# Patient Record
Sex: Female | Born: 1985 | Race: White | Hispanic: Yes | Marital: Married | Smoking: Never smoker
Health system: Southern US, Community
[De-identification: ages and names within clinical notes are randomized; demographics above are authoritative.]

## PROBLEM LIST (undated history)

## (undated) DIAGNOSIS — Z789 Other specified health status: Secondary | ICD-10-CM

## (undated) HISTORY — PX: NO PAST SURGERIES: SHX2092

## (undated) HISTORY — DX: Other specified health status: Z78.9

---

## 2008-09-05 ENCOUNTER — Emergency Department (HOSPITAL_COMMUNITY): Admission: EM | Admit: 2008-09-05 | Discharge: 2008-09-05 | Payer: Self-pay | Admitting: Emergency Medicine

## 2010-06-13 ENCOUNTER — Ambulatory Visit: Payer: Self-pay | Admitting: Gynecology

## 2010-06-13 ENCOUNTER — Inpatient Hospital Stay (HOSPITAL_COMMUNITY): Admission: AD | Admit: 2010-06-13 | Discharge: 2010-06-13 | Payer: Self-pay | Admitting: Obstetrics and Gynecology

## 2010-10-23 LAB — URINALYSIS, ROUTINE W REFLEX MICROSCOPIC
Bilirubin Urine: NEGATIVE
Ketones, ur: NEGATIVE mg/dL
Protein, ur: NEGATIVE mg/dL
Urobilinogen, UA: 0.2 mg/dL (ref 0.0–1.0)

## 2010-10-23 LAB — WET PREP, GENITAL

## 2010-10-23 LAB — URINE MICROSCOPIC-ADD ON

## 2010-10-23 LAB — CBC
Platelets: 259 10*3/uL (ref 150–400)
RDW: 13.2 % (ref 11.5–15.5)
WBC: 9.4 10*3/uL (ref 4.0–10.5)

## 2010-10-23 LAB — ABO/RH: ABO/RH(D): A POS

## 2010-10-23 LAB — GC/CHLAMYDIA PROBE AMP, GENITAL
Chlamydia, DNA Probe: NEGATIVE
GC Probe Amp, Genital: NEGATIVE

## 2010-11-26 LAB — DIFFERENTIAL
Basophils Absolute: 0 10*3/uL (ref 0.0–0.1)
Eosinophils Relative: 1 % (ref 0–5)
Lymphocytes Relative: 29 % (ref 12–46)
Monocytes Absolute: 0.4 10*3/uL (ref 0.1–1.0)
Monocytes Relative: 6 % (ref 3–12)
Neutro Abs: 4.1 10*3/uL (ref 1.7–7.7)

## 2010-11-26 LAB — COMPREHENSIVE METABOLIC PANEL
AST: 17 U/L (ref 0–37)
Albumin: 3.6 g/dL (ref 3.5–5.2)
Chloride: 103 mEq/L (ref 96–112)
Creatinine, Ser: 0.39 mg/dL — ABNORMAL LOW (ref 0.4–1.2)
GFR calc Af Amer: >60 mL/min (ref >60)
Potassium: 4 mEq/L (ref 3.5–5.1)
Total Bilirubin: 0.8 mg/dL (ref 0.3–1.2)
Total Protein: 7.3 g/dL (ref 6.0–8.3)

## 2010-11-26 LAB — URINALYSIS, ROUTINE W REFLEX MICROSCOPIC
Nitrite: NEGATIVE
Protein, ur: NEGATIVE mg/dL
Specific Gravity, Urine: 1.021 (ref 1.005–1.030)
Urobilinogen, UA: 1 mg/dL (ref 0.0–1.0)

## 2010-11-26 LAB — CBC
MCV: 83.8 fL (ref 78.0–100.0)
Platelets: 252 10*3/uL (ref 150–400)
RDW: 13.8 % (ref 11.5–15.5)
WBC: 6.4 10*3/uL (ref 4.0–10.5)

## 2010-11-26 LAB — POCT PREGNANCY, URINE: Preg Test, Ur: NEGATIVE

## 2011-01-11 ENCOUNTER — Inpatient Hospital Stay (HOSPITAL_COMMUNITY): Admission: AD | Admit: 2011-01-11 | Payer: Self-pay | Admitting: Obstetrics

## 2011-01-16 ENCOUNTER — Inpatient Hospital Stay (HOSPITAL_COMMUNITY)
Admission: AD | Admit: 2011-01-16 | Discharge: 2011-01-19 | DRG: 775 | Disposition: A | Discharge status: Home / Self Care | Payer: Medicaid Other | Source: Ambulatory Visit | Attending: Obstetrics | Admitting: Obstetrics

## 2011-01-16 LAB — COMPREHENSIVE METABOLIC PANEL
AST: 17 U/L (ref 0–37)
BUN: 6 mg/dL (ref 6–23)
CO2: 23 mEq/L (ref 19–32)
Calcium: 8.9 mg/dL (ref 8.4–10.5)
Chloride: 102 mEq/L (ref 96–112)
Creatinine, Ser: <0.47 mg/dL (ref 0.4–1.2)
Glucose, Bld: 71 mg/dL (ref 70–99)
Total Bilirubin: 0.4 mg/dL (ref 0.3–1.2)

## 2011-01-16 LAB — URIC ACID: Uric Acid, Serum: 6 mg/dL (ref 2.4–7.0)

## 2011-01-16 LAB — LACTATE DEHYDROGENASE: LDH: 198 U/L (ref 94–250)

## 2011-01-16 LAB — CBC
HCT: 33.8 % — ABNORMAL LOW (ref 36.0–46.0)
HCT: 32.6 % — ABNORMAL LOW (ref 36.0–46.0)
Hemoglobin: 11.2 g/dL — ABNORMAL LOW (ref 12.0–15.0)
MCH: 27.1 pg (ref 26.0–34.0)
MCHC: 33.1 g/dL (ref 30.0–36.0)
MCHC: 33.7 g/dL (ref 30.0–36.0)
MCV: 81.8 fL (ref 78.0–100.0)
Platelets: 153 10*3/uL (ref 150–400)
RBC: 4.13 MIL/uL (ref 3.87–5.11)
RDW: 21.6 % — ABNORMAL HIGH (ref 11.5–15.5)
WBC: 10.6 10*3/uL — ABNORMAL HIGH (ref 4.0–10.5)

## 2011-01-16 LAB — RPR: RPR Ser Ql: NONREACTIVE

## 2011-01-17 DIAGNOSIS — O24419 Gestational diabetes mellitus in pregnancy, unspecified control: Secondary | ICD-10-CM

## 2011-01-18 LAB — CBC
MCV: 82 fL (ref 78.0–100.0)
Platelets: 156 10*3/uL (ref 150–400)
RBC: 2.94 MIL/uL — ABNORMAL LOW (ref 3.87–5.11)
RDW: 22.3 % — ABNORMAL HIGH (ref 11.5–15.5)
WBC: 14.4 10*3/uL — ABNORMAL HIGH (ref 4.0–10.5)

## 2011-01-20 LAB — MRSA CULTURE

## 2011-03-26 IMAGING — US US OB COMP LESS 14 WK
1 series · 14 of 28 positions shown · non-contrast
Comparison: None.

CLINICAL DATA: 23-year-old female with bleeding and pain at 14
weeks.

OBSTETRIC <14 WK ULTRASOUND
TECHNIQUE: Transabdominal ultrasound was performed for evaluation
of the gestation as well as the maternal uterus and adnexal
regions.

[Series 1: us ob comp less 14 wks · 0.24mm/px · 14 of 30 slices shown]
[im 2/30]
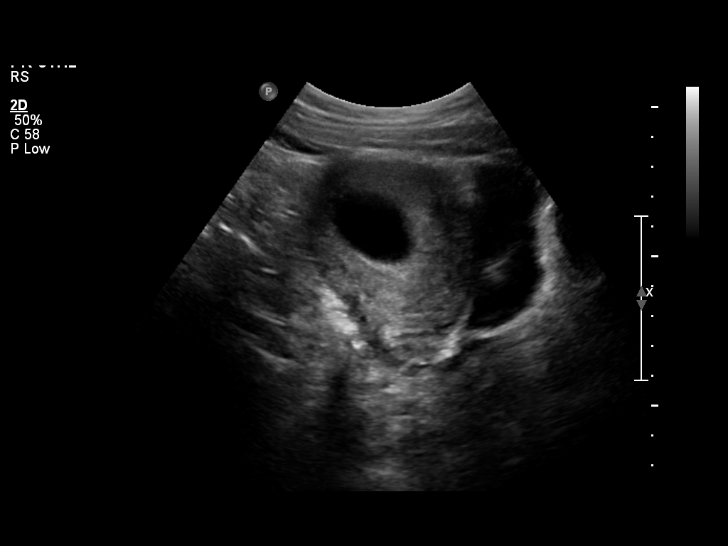
[im 4/30]
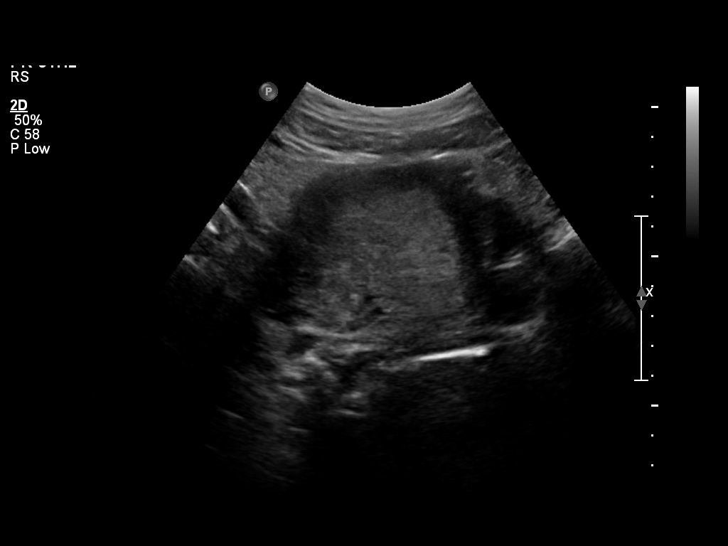
[im 6/30]
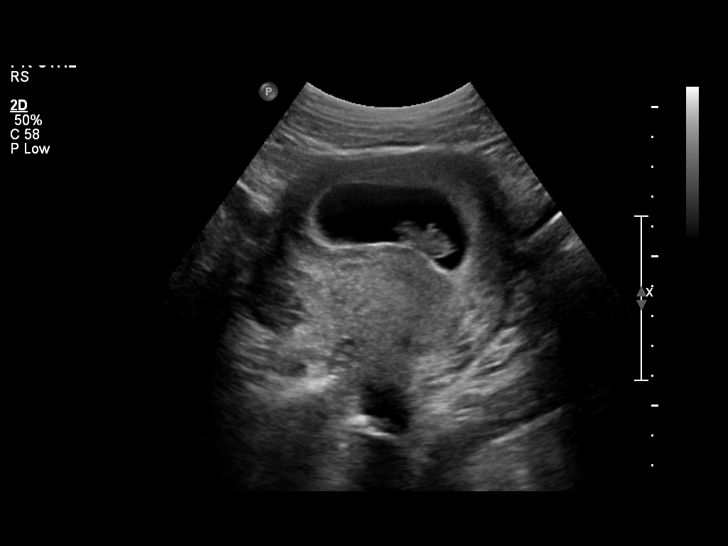
[im 8/30]
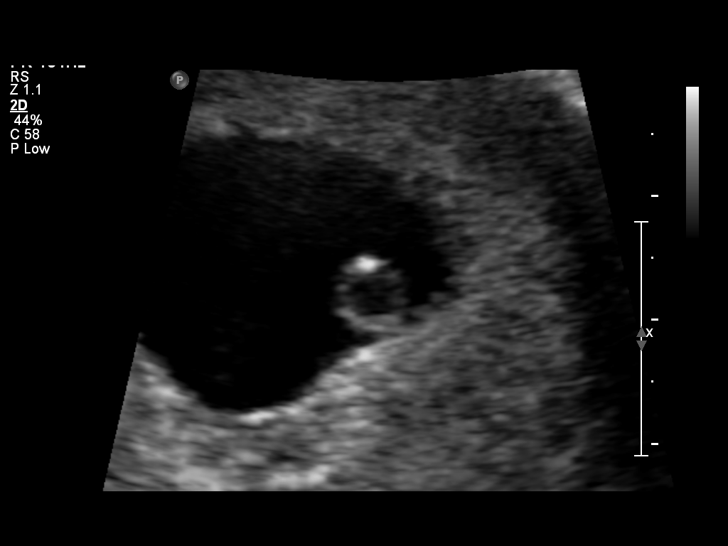
[im 10/30]
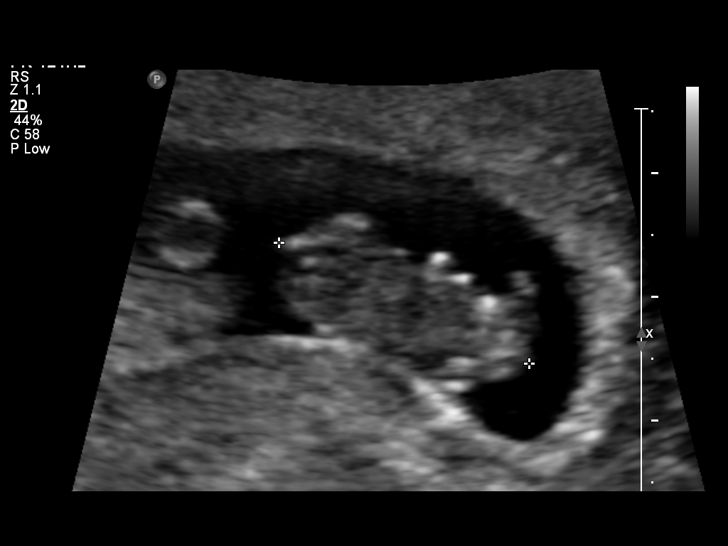
[im 12/30]
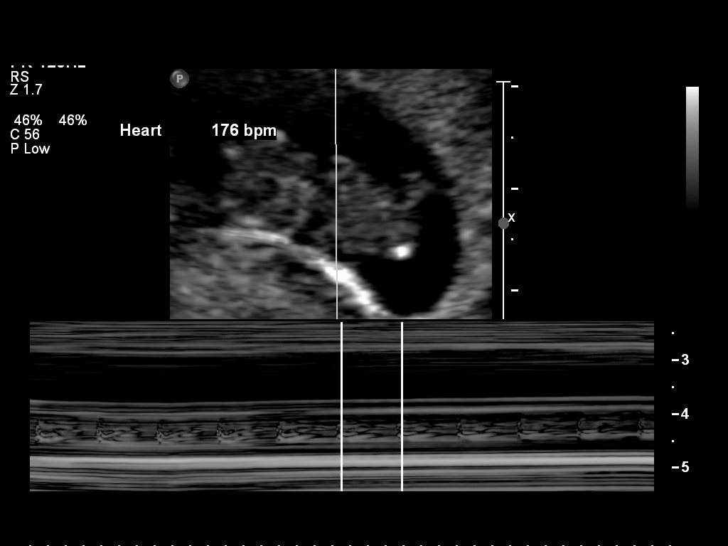
[im 14/30]
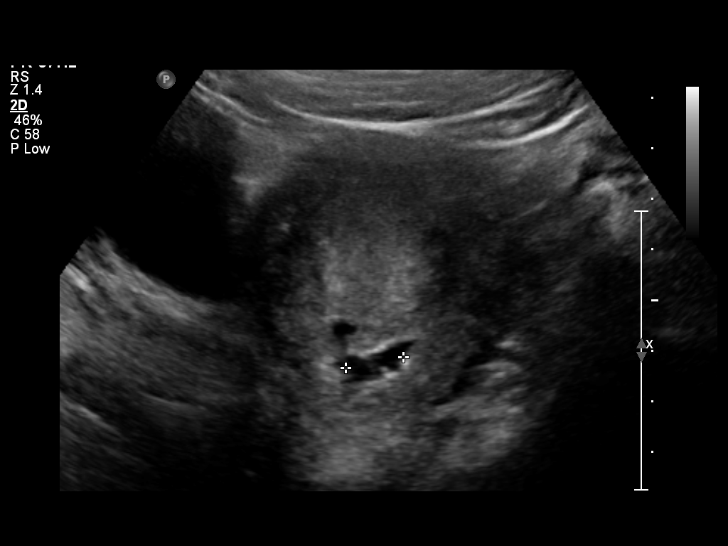
[im 17/30]
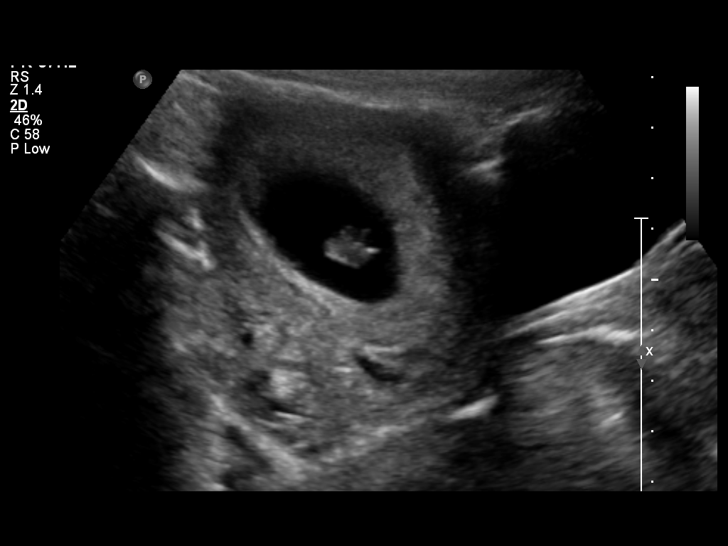
[im 19/30]
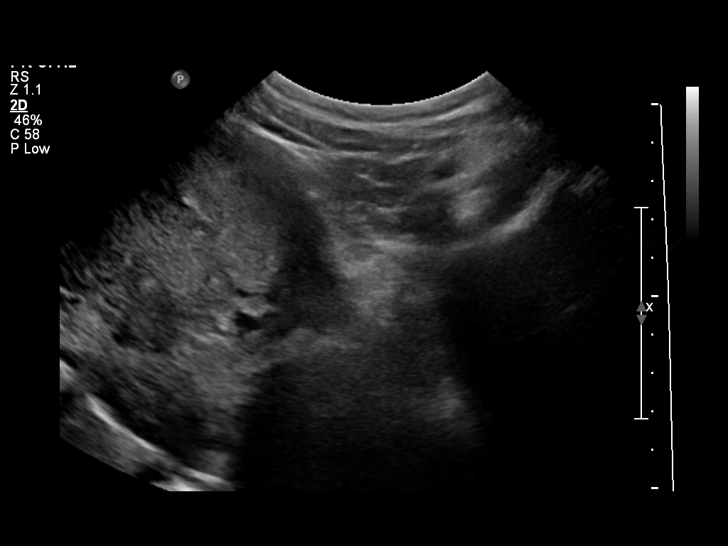
[im 21/30]
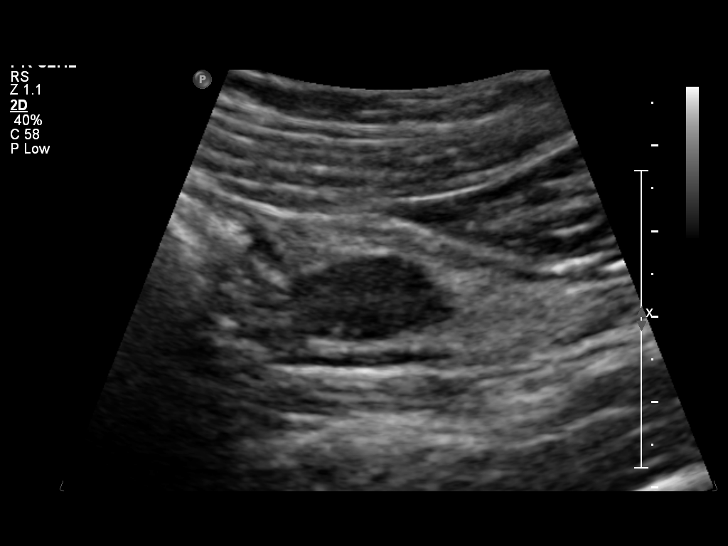
[im 23/30]
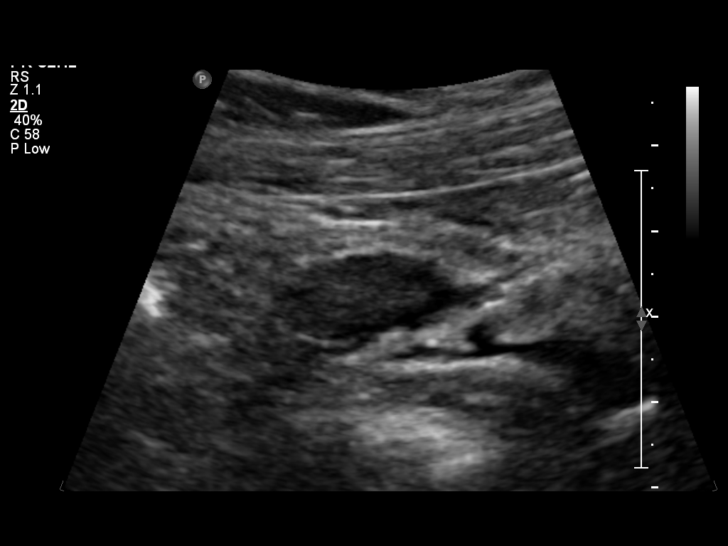
[im 25/30]
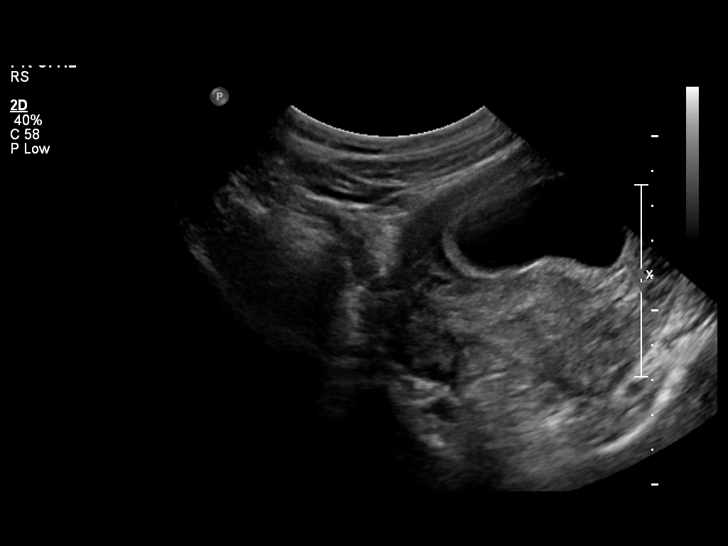
[im 27/30]
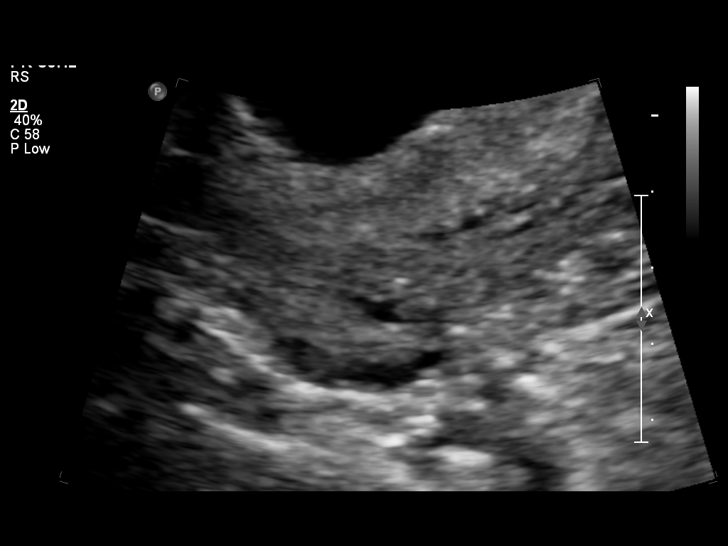
[im 30/30]
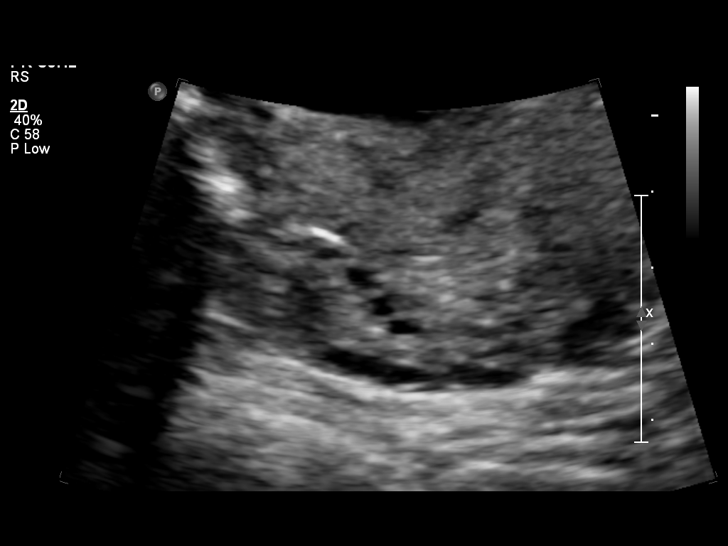

[14 of 28 positions shown; findings below may reference images not displayed]

Intrauterine gestational sac: Single
Yolk sac: Present
Embryo: Present
Cardiac Activity: Detected
Heart Rate: 176 bpm

CRL:  2.2 cm mm  9w  0d

Maternal uterus/Adnexae:
Small volume of subchorionic hemorrhage.  No pelvic free fluid.
Both ovaries appear within normal limits, the left measuring 1.8 x
2.1 x 1.0 cm and the right measuring 2.4 x 3.2 x 1.5 cm.
IMPRESSION: Viable singleton intrauterine pregnancy with estimated gestational
age of 9 weeks and 0 days by crown-rump length.  Small volume
subchorionic hemorrhage.

## 2011-09-14 ENCOUNTER — Encounter (HOSPITAL_COMMUNITY): Payer: Self-pay | Admitting: Emergency Medicine

## 2011-09-14 ENCOUNTER — Emergency Department (HOSPITAL_COMMUNITY)
Admission: EM | Admit: 2011-09-14 | Discharge: 2011-09-14 | Disposition: A | Discharge status: Home / Self Care | Payer: Self-pay | Attending: Emergency Medicine | Admitting: Emergency Medicine

## 2011-09-14 DIAGNOSIS — R1013 Epigastric pain: Secondary | ICD-10-CM | POA: Insufficient documentation

## 2011-09-14 LAB — POCT PREGNANCY, URINE: Preg Test, Ur: NEGATIVE

## 2011-09-14 LAB — URINALYSIS, MICROSCOPIC ONLY
Glucose, UA: NEGATIVE mg/dL
Nitrite: NEGATIVE
Specific Gravity, Urine: 1.016 (ref 1.005–1.030)
pH: 7.5 (ref 5.0–8.0)

## 2011-09-14 LAB — CBC
MCH: 28.2 pg (ref 26.0–34.0)
MCHC: 35.1 g/dL (ref 30.0–36.0)
RDW: 13.6 % (ref 11.5–15.5)

## 2011-09-14 LAB — BASIC METABOLIC PANEL
Chloride: 101 mEq/L (ref 96–112)
Creatinine, Ser: 0.48 mg/dL — ABNORMAL LOW (ref 0.50–1.10)
GFR calc Af Amer: >90 mL/min (ref >90)
GFR calc non Af Amer: >90 mL/min (ref >90)

## 2011-09-14 LAB — HEPATIC FUNCTION PANEL
ALT: 32 U/L (ref 0–35)
Bilirubin, Direct: <0.1 mg/dL (ref 0.0–0.3)
Total Protein: 8.5 g/dL — ABNORMAL HIGH (ref 6.0–8.3)

## 2011-09-14 LAB — DIFFERENTIAL
Basophils Absolute: 0 10*3/uL (ref 0.0–0.1)
Basophils Relative: 0 % (ref 0–1)
Eosinophils Absolute: 0.1 10*3/uL (ref 0.0–0.7)
Monocytes Absolute: 0.6 10*3/uL (ref 0.1–1.0)
Neutro Abs: 5.3 10*3/uL (ref 1.7–7.7)
Neutrophils Relative %: 64 % (ref 43–77)

## 2011-09-14 LAB — LIPASE, BLOOD: Lipase: 38 U/L (ref 11–59)

## 2011-09-14 NOTE — ED Notes (Signed)
Pt states that she is having abdominal pain. Pt states pain started today and that pain is mid abdomen. Pt denies any problems with urination or bowel movements. Pt denies N/V/D. Pt alert and oriented and able to follow commands and move extremities.

## 2011-09-14 NOTE — ED Provider Notes (Signed)
History     CSN: [id number]  Arrival date & time 09/14/11  0046   First MD Initiated Contact with Patient 09/14/11 0126      Chief Complaint  Patient presents with  . Abdominal Pain    (Consider location/radiation/quality/duration/timing/severity/associated sxs/prior treatment) HPI This 26 year old female is now pain free but had a few hours of upper abdominal epigastric pain prior to arrival tonight. She had no associated symptoms and has not had this discomfort before but it is now resolved. She has had no fever cough chest pain shortness of breath lower abdominal pain vaginal bleeding vaginal discharge dysuria or other concerns. There was no treatment prior to arrival. Her pain was moderate but is now resolved. Her pain was nonradiating. History reviewed. No pertinent past medical history.  History reviewed. No pertinent past surgical history.  No family history on file.  History  Substance Use Topics  . Smoking status: Never Smoker   . Smokeless tobacco: Not on file  . Alcohol Use: No    OB History    Grav Para Term Preterm Abortions TAB SAB Ect Mult Living                  Review of Systems  Constitutional: Negative for fever.       10 Systems reviewed and are negative for acute change except as noted in the HPI.  HENT: Negative for congestion.   Eyes: Negative for discharge and redness.  Respiratory: Negative for cough and shortness of breath.   Cardiovascular: Negative for chest pain.  Gastrointestinal: Positive for abdominal pain. Negative for vomiting and diarrhea.  Genitourinary: Negative for dysuria, vaginal bleeding and vaginal discharge.  Musculoskeletal: Negative for back pain.  Skin: Negative for rash.  Neurological: Negative for syncope, numbness and headaches.  Psychiatric/Behavioral:       No behavior change.    Allergies  Review of patient's allergies indicates no known allergies.  Home Medications  No current outpatient prescriptions on  file.  BP 120/89  Pulse 78  Temp(Src) 97.8 F (36.6 C) (Oral)  Resp 16  SpO2 100%  LMP 09/12/2011  Physical Exam  Nursing note and vitals reviewed. Constitutional:       Awake, alert, nontoxic appearance.  HENT:  Head: Atraumatic.  Eyes: Right eye exhibits no discharge. Left eye exhibits no discharge.  Neck: Neck supple.  Cardiovascular: Normal rate and regular rhythm.   No murmur heard. Pulmonary/Chest: Effort normal and breath sounds normal. No respiratory distress. She has no wheezes. She has no rales. She exhibits no tenderness.  Abdominal: Soft. There is no tenderness. There is no rebound.       No CVA tenderness  Musculoskeletal: She exhibits no edema and no tenderness.       Baseline ROM, no obvious new focal weakness.  Neurological:       Mental status and motor strength appears baseline for patient and situation.  Skin: No rash noted.  Psychiatric: She has a normal mood and affect.    ED Course  Procedures (including critical care time) The patient remains pain-free.Patient / Family / Caregiver informed of clinical course, understand medical decision-making process, and agree with plan. Labs Reviewed  URINALYSIS, WITH MICROSCOPIC - Abnormal; Notable for the following:    APPearance CLOUDY (*)    Leukocytes, UA TRACE (*)    Bacteria, UA FEW (*)    Squamous Epithelial / LPF MANY (*)    All other components within normal limits  BASIC METABOLIC PANEL - Abnormal;  Notable for the following:    Glucose, Bld 132 (*)    Creatinine, Ser 0.48 (*)    All other components within normal limits  HEPATIC FUNCTION PANEL - Abnormal; Notable for the following:    Total Protein 8.5 (*)    All other components within normal limits  CBC  DIFFERENTIAL  LIPASE, BLOOD  POCT PREGNANCY, URINE  LAB REPORT - SCANNED   No results found.   1. Abdominal pain       MDM  I doubt any other EMC precluding discharge at this time including, but not necessarily limited to the  following:SBI, peritonitis.        Hurman Horn, MD 09/15/11 [phone]

## 2011-09-14 NOTE — ED Notes (Signed)
PT. REPORTS UPPER ABDOMINAL PAIN ONSET TODAY , DENIES NAUSEA, VOMITTING OR DIARRHEA , NO FEVER OR CHILLS . NO DYSURIA.

## 2014-05-30 ENCOUNTER — Other Ambulatory Visit (HOSPITAL_COMMUNITY): Payer: Self-pay | Admitting: Physician Assistant

## 2014-05-30 DIAGNOSIS — Z3689 Encounter for other specified antenatal screening: Secondary | ICD-10-CM

## 2014-05-30 LAB — OB RESULTS CONSOLE ANTIBODY SCREEN: ANTIBODY SCREEN: NEGATIVE

## 2014-05-30 LAB — SICKLE CELL SCREEN

## 2014-05-30 LAB — CYTOLOGY - PAP: Pap: NEGATIVE

## 2014-05-30 LAB — OB RESULTS CONSOLE GC/CHLAMYDIA
Chlamydia: NEGATIVE
GC PROBE AMP, GENITAL: NEGATIVE

## 2014-05-30 LAB — OB RESULTS CONSOLE ABO/RH: RH Type: POSITIVE

## 2014-05-30 LAB — OB RESULTS CONSOLE VARICELLA ZOSTER ANTIBODY, IGG
Varicella: IMMUNE
Varicella: IMMUNE

## 2014-05-30 LAB — GLUCOSE TOLERANCE, 1 HOUR (50G) W/O FASTING: GLUCOSE 1 HOUR GTT: 109 mg/dL (ref ?–200)

## 2014-05-30 LAB — OB RESULTS CONSOLE HGB/HCT, BLOOD
HCT: 36 %
Hemoglobin: 11.5 g/dL

## 2014-05-30 LAB — OB RESULTS CONSOLE HIV ANTIBODY (ROUTINE TESTING): HIV: NONREACTIVE

## 2014-05-30 LAB — OB RESULTS CONSOLE HEPATITIS B SURFACE ANTIGEN: HEP B S AG: NEGATIVE

## 2014-05-30 LAB — OB RESULTS CONSOLE RUBELLA ANTIBODY, IGM: Rubella: IMMUNE

## 2014-05-30 LAB — OB RESULTS CONSOLE PLATELET COUNT: Platelets: 248 10*3/uL

## 2014-05-30 LAB — OB RESULTS CONSOLE RPR: RPR: NONREACTIVE

## 2014-05-30 LAB — CULTURE, OB URINE: Urine Culture, OB: NEGATIVE

## 2014-07-06 ENCOUNTER — Ambulatory Visit (HOSPITAL_COMMUNITY): Payer: Medicaid Other

## 2014-08-12 NOTE — L&D Delivery Note (Signed)
Patient is 29 y.o. G2P1001 515w1d admitted IOL, hx of A2DM   Delivery Note At 4:24 AM a viable female was delivered via Vaginal, Spontaneous Delivery (Presentation: ROA ;  ).  APGAR: 8, 9; weight pending .   Placenta status: Intact, Spontaneous.  Cord: 3 vessels with the following complications: None.  Infant dried and placed on mother's abdomen.  Cord clamped and cut by FOB.  Hospital cord blood sample collected.  Anesthesia: Epidural  Episiotomy: None Lacerations:  2nd degree perineal repaired by Dr Fredirick LatheKristy Acosta Suture Repair: 3.0 vicryl rapide Est. Blood Loss (mL):  250cc  Mom to postpartum.  Baby to Couplet care / Skin to Skin.  Delynn FlavinGottschalk, Ashly M, DO 11/27/2014, 4:51 AM

## 2014-09-15 LAB — GLUCOSE TOLERANCE, 1 HOUR (50G) W/O FASTING: Glucose, GTT - 1 Hour: 169 mg/dL (ref ?–200)

## 2014-09-15 LAB — OB RESULTS CONSOLE HGB/HCT, BLOOD
HCT: 35 %
Hemoglobin: 11 g/dL

## 2014-09-15 LAB — OB RESULTS CONSOLE RPR: RPR: NONREACTIVE

## 2014-09-19 LAB — GLUCOSE TOLERANCE, 3 HOURS
GLUCOSE 1 HOUR GTT: 191 mg/dL (ref ?–200)
Glucose, Fasting: 79 mg/dL (ref 60–109)
Glucose, GTT - 2 Hour: 168 mg/dL — AB (ref ?–140)
Glucose, GTT - 3 Hour: 144 mg/dL — AB (ref ?–140)

## 2014-10-03 ENCOUNTER — Encounter: Payer: Self-pay | Attending: Obstetrics & Gynecology | Admitting: *Deleted

## 2014-10-03 ENCOUNTER — Ambulatory Visit: Payer: Medicaid Other | Admitting: *Deleted

## 2014-10-03 VITALS — Wt 117.6 lb

## 2014-10-03 DIAGNOSIS — O24410 Gestational diabetes mellitus in pregnancy, diet controlled: Secondary | ICD-10-CM

## 2014-10-03 DIAGNOSIS — Z713 Dietary counseling and surveillance: Secondary | ICD-10-CM | POA: Insufficient documentation

## 2014-10-03 DIAGNOSIS — O24913 Unspecified diabetes mellitus in pregnancy, third trimester: Secondary | ICD-10-CM | POA: Insufficient documentation

## 2014-10-03 NOTE — Progress Notes (Signed)
Nutrition note: GDM diet education Pt is a newly diagnosed GDM pt. Pt has gained 7.6# @ 31w, which is < expected. Pt reports eating ~3x/d. Pt is taking a PNV. Pt reports no N/V but has heartburn occ. NKFA. Pt reports walking 2-3x/wk for 15mins. Pt received verbal & written education in Spanish via an interpreter about GDM diet. Encouraged walking/ physical activity daily. Encouraged pt to include a protein source with all meals & snacks and to eat q 2-3hrs to help pt to gain more wt. Discussed wt gain goals of 25-35# to 1#/wk. Pt agrees to follow GDM diet with 3 meals & 1-3 snacks/d with proper CHO/ protein combination. Pt has WIC & plans to BF. F/u in 2-4 wks Doris RevealLaura Bernis Schreur, MS, RD, LDN, [facility]

## 2014-10-03 NOTE — Progress Notes (Signed)
  Patient was seen on 10/03/14 for Gestational Diabetes self-management .Verdis Frederickson In house Interpreter used for session.  The following learning objectives were met by the patient :   States the definition of Gestational Diabetes  States when to check blood glucose levels  Demonstrates proper blood glucose monitoring techniques  States the effect of stress and exercise on blood glucose levels  States the importance of limiting caffeine and abstaining from alcohol and smoking  Plan:  Consider  increasing your activity level by walking daily as tolerated Begin checking BG before breakfast and 2 hours after first bit of breakfast, lunch and dinner after  as directed by MD  Take medication  as directed by MD  Blood glucose monitor given: TrueTrack Lot # [id number] Exp: 2016/03/11  Patient instructed to monitor glucose levels: FBS: 60 - <90 2 hour: <120  Patient received the following handouts:  Nutrition Diabetes and Pregnancy  Carbohydrate Counting List  Meal Planning worksheet  Patient will be seen for follow-up as needed.

## 2014-10-04 ENCOUNTER — Encounter: Payer: Self-pay | Admitting: *Deleted

## 2014-10-04 DIAGNOSIS — O0993 Supervision of high risk pregnancy, unspecified, third trimester: Secondary | ICD-10-CM

## 2014-10-04 DIAGNOSIS — O0990 Supervision of high risk pregnancy, unspecified, unspecified trimester: Secondary | ICD-10-CM | POA: Insufficient documentation

## 2014-10-10 ENCOUNTER — Encounter: Payer: Self-pay | Attending: Obstetrics & Gynecology | Admitting: *Deleted

## 2014-10-10 ENCOUNTER — Ambulatory Visit (INDEPENDENT_AMBULATORY_CARE_PROVIDER_SITE_OTHER): Payer: Self-pay | Admitting: Obstetrics & Gynecology

## 2014-10-10 ENCOUNTER — Encounter: Payer: Self-pay | Admitting: Obstetrics & Gynecology

## 2014-10-10 VITALS — BP 120/76 | HR 97 | Temp 98.1°F | Ht 59.5 in | Wt 117.6 lb

## 2014-10-10 DIAGNOSIS — Z8632 Personal history of gestational diabetes: Secondary | ICD-10-CM | POA: Insufficient documentation

## 2014-10-10 DIAGNOSIS — O24913 Unspecified diabetes mellitus in pregnancy, third trimester: Secondary | ICD-10-CM

## 2014-10-10 DIAGNOSIS — Z713 Dietary counseling and surveillance: Secondary | ICD-10-CM | POA: Insufficient documentation

## 2014-10-10 LAB — POCT URINALYSIS DIP (DEVICE)
BILIRUBIN URINE: NEGATIVE
GLUCOSE, UA: NEGATIVE mg/dL
KETONES UR: NEGATIVE mg/dL
Nitrite: NEGATIVE
PROTEIN: NEGATIVE mg/dL
SPECIFIC GRAVITY, URINE: 1.01 (ref 1.005–1.030)
Urobilinogen, UA: 0.2 mg/dL (ref 0.0–1.0)
pH: 7 (ref 5.0–8.0)

## 2014-10-10 NOTE — Progress Notes (Signed)
[facility], has started testing and most< 120, rare to 151, FBS <90  Subjective:transfer gest DM    Doris Hendricks is a G2P1001 6957w2d being seen today for her first obstetrical visit.  Her obstetrical history is significant for failed 3 hr GTT. Patient does intend to breast feed. Pregnancy history fully reviewed.  Patient reports no complaints.  Filed Vitals:   10/10/14 0828 10/10/14 0831  BP: 120/76   Pulse: 97   Temp: 98.1 F (36.7 C)   Height:  4' 11.5" (1.511 m)  Weight: 117 lb 9.6 oz (53.343 kg)     HISTORY: OB History  Gravida Para Term Preterm AB SAB TAB Ectopic Multiple Living  2 1 1  0 0 0 0 0 0 1    # Outcome Date GA Lbr Len/2nd Weight Sex Delivery Anes PTL Lv  2 Current           1 Term 01/17/11 2122w0d  7 lb (3.175 kg) M Vag-Spont   Y     Past Medical History  Diagnosis Date  . Medical history non-contributory    Past Surgical History  Procedure Laterality Date  . No past surgeries     Family History  Problem Relation Age of Onset  . Diabetes Mother   . Hypertension Mother      Exam    Uterus:  Fundal Height: 32 cm  Pelvic Exam:                                    Skin: normal coloration and turgor, no rashes    Neurologic: oriented, normal mood   Extremities: normal strength, tone, and muscle mass   HEENT extra ocular movement intact and sclera clear, anicteric   Mouth/Teeth dental hygiene good   Neck supple   Cardiovascular: regular rate and rhythm   Respiratory:  appears well, vitals normal, no respiratory distress, acyanotic, normal RR   Abdomen: soft, non-tender; bowel sounds normal; no masses,  no organomegaly   Urinary:        Assessment:    Pregnancy: G2P1001 Patient Active Problem List   Diagnosis Date Noted  . Diabetes mellitus affecting pregnancy in third trimester, antepartum 10/10/2014  . Supervision of high-risk pregnancy 10/04/2014        Plan:     Initial labs drawn. Prenatal  vitamins.50 Problem list reviewed and updated. Genetic Screening discussed Quad Screen: need result.  Ultrasound discussed; fetal survey: results reviewed.  Follow up in 1 weeks. 50% of 30 min visit spent on counseling and coordination of care.  BG testing, diet   ARNOLD,JAMES 10/10/2014

## 2014-10-10 NOTE — Progress Notes (Signed)
Patient reports occasional pelvic pain Okey Regalarol used for interpreter

## 2014-10-10 NOTE — Progress Notes (Signed)
  Patient was seen on 10/10/14 for Gestational Diabetes self-management . The following learning objectives were met by the patient :   States the definition of Gestational Diabetes  States when to check blood glucose levels  Demonstrates proper blood glucose monitoring techniques  States the effect of stress and exercise on blood glucose levels  States the importance of limiting caffeine and abstaining from alcohol and smoking  Plan:  Aim for 2 Carb Choices per meal (30 grams) +/- 1 either way for breakfast Aim for 3 Carb Choices per meal (45 grams) +/- 1 either way from lunch and dinner Aim for 1-2 Carbs per snack Begin reading food labels for Total Carbohydrate and sugar grams of foods Consider  increasing your activity level by walking daily as tolerated Begin checking BG before breakfast and 2 hours after first bit of breakfast, lunch and dinner after  as directed by MD  Take medication  as directed by MD  Blood glucose monitor given: TrueTrack Lot # [id number] Exp: 2016/10/09 Blood glucose reading: $RemoveBeforeDE'67mg'[id number]$ /dl  FBS  Patient instructed to monitor glucose levels: FBS: 60 - <90 2 hour: <120  Patient received the following handouts:  Nutrition Diabetes and Pregnancy  Patient will be seen for follow-up as needed.

## 2014-10-10 NOTE — Patient Instructions (Signed)
Diabetes mellitus gestacional (Gestational Diabetes Mellitus) La diabetes mellitus gestacional, ms comnmente conocida como diabetes gestacional es un tipo de diabetes que desarrollan algunas mujeres durante el embarazo. En la diabetes gestacional, el pncreas no produce suficiente insulina (una hormona) o las clulas son menos sensibles a la insulina producida (resistencia a la insulina), o ambas cosas. Normalmente, la insulina mueve los azcares de los alimentos a las clulas de los tejidos. Las clulas de los tejidos utilizan los azcares para obtener energa. La falta de insulina o la falta de una respuesta normal a la insulina hace que el exceso de azcar se acumule en la sangre en lugar de penetrar en las clulas de los tejidos. Como resultado, se producen niveles altos de azcar en la sangre (hiperglucemia). El efecto de los niveles altos de azcar (glucosa) puede causar muchos problemas.  FACTORES DE RIESGO Usted tiene mayor probabilidad de desarrollar diabetes gestacional si tiene antecedentes familiares de diabetes y tambin si tiene uno o ms de los siguientes factores de riesgo:  ndice de masa corporal superior a 30 (obesidad).  Embarazo previo con diabetes gestacional.  La edad avanzada en el momento del embarazo. Si se mantienen los niveles de glucosa en la sangre en un rango normal durante el embarazo, las mujeres pueden tener un embarazo saludable. Si los niveles de glucosa en la sangre no estn bien controlados, puede haber riesgos para usted, el feto o el recin nacido, o durante el trabajo de parto y el parto.  SNTOMAS  Si se presentan sntomas, stos son similares a los sntomas que normalmente experimentar durante el embarazo. Los sntomas de la diabetes gestacional son:   Aumento de la sed (polidipsia).  Aumento de la miccin (poliuria).  Orina con ms frecuencia durante la noche (nocturia).  Prdida de peso. La prdida de peso puede ser muy rpida.  Infecciones  frecuentes y recurrentes.  Cansancio (fatiga).  Debilidad.  Cambios en la visin, como visin borrosa.  Olor a fruta en el aliento.  Dolor abdominal. DIAGNSTICO La diabetes se diagnostica cuando hay aumento de los niveles de glucosa en la sangre. El nivel de glucosa en la sangre puede controlarse en uno o ms de los siguientes anlisis de sangre:  Medicin de glucosa en la sangre en ayunas. No se le permitir comer durante al menos 8 horas antes de que se tome una muestra de sangre.  Pruebas al azar de glucosa en la sangre. El nivel de glucosa en la sangre se controla en cualquier momento del da sin importar el momento en que haya comido.  Prueba de A1c (hemoglobina glucosilada) Una prueba de A1c proporciona informacin sobre el control de la glucosa en la sangre durante los ltimos 3 meses.  Prueba de tolerancia a la glucosa oral (PTGO). La glucosa en la sangre se mide despus de no haber comido (ayunas) durante una a tres horas y despus de beber una bebida que contenga glucosa. Dado que las hormonas que causan la resistencia a la insulina son ms altas alrededor de las semanas 24 a 28 de embarazo, generalmente se realiza una PTGO durante ese tiempo. Si tiene factores de riesgo de diabetes gestacional, su mdico puede hacerle estudios de deteccin antes de las 24semanas de embarazo. TRATAMIENTO   Usted tendr que tomar medicamentos para la diabetes o insulina diariamente para mantener los niveles de glucosa en la sangre en el rango deseado.  Usted tendr que combinar la dosis de insulina con la actividad fsica y la eleccin de alimentos saludables. El objetivo del   tratamiento es mantener el nivel de azcar en la sangre previo a comer (preprandial) y durante la noche entre 60 y 99mg/dl, durante todo el embarazo. El objetivo del tratamiento es mantener el nivel pico de azcar en la sangre despus de comer (glucosa posprandial) entre 100y 140mg/dl. INSTRUCCIONES PARA EL CUIDADO EN EL  HOGAR   Controle su nivel de hemoglobina A1c dos veces al ao.  Contrlese a diario el nivel de glucosa en la sangre segn las indicaciones de su mdico. Es comn realizar controles frecuentes de la glucosa en la sangre.  Supervise las cetonas en la orina cuando est enferma y segn las indicaciones de su mdico.  Tome el medicamento para la diabetes y adminstrese insulina segn las indicaciones de su mdico para mantener el nivel de glucosa en la sangre en el rango deseado.  Nunca se quede sin medicamento para la diabetes o sin insulina. Es necesario que la reciba todos los das.  Ajuste la insulina segn la ingesta de hidratos de carbono. Los hidratos de carbono pueden aumentar los niveles de glucosa en la sangre, pero deben incluirse en su dieta. Los hidratos de carbono aportan vitaminas, minerales y fibra que son una parte esencial de una dieta saludable. Los hidratos de carbono se encuentran en frutas, verduras, cereales integrales, productos lcteos, legumbres y alimentos que contienen azcares aadidos.  Consuma alimentos saludables. Alterne 3 comidas con 3 colaciones.  Aumente de peso saludablemente. El aumento del peso total vara de acuerdo con el ndice de masa corporal que tena antes del embarazo ([facility]).  Lleve una tarjeta de alerta mdica o use una pulsera o medalla de alerta mdica.  Lleve con usted una colacin de 15gramos de hidratos de carbono en todo momento para controlar los niveles bajos de glucosa en la sangre (hipoglucemia). Algunos ejemplos de colaciones de 15gramos de hidratos de carbono son los siguientes:  Tabletas de glucosa, 3 o 4.  Gel de glucosa, tubo de 15 gramos.  Pasas de uva, 2 cucharadas (24 g).  Caramelos de goma, 6.  Galletas de animales, 8.  Jugo de fruta, gaseosa comn, o leche descremada, 4 onzas (120 ml).  Pastillas de goma, 9.  Reconocer la hipoglucemia. Durante el embarazo la hipoglucemia se produce cuando hay niveles de glucosa en la  sangre de 60 mg/dl o menos. El riesgo de hipoglucemia aumenta durante el ayuno o cuando se saltea las comidas, durante o despus de realizar ejercicio intenso y mientras duerme. Los sntomas de hipoglucemia son:  Temblores o sacudidas.  Disminucin de la capacidad de concentracin.  Sudoracin.  Aumento de la frecuencia cardaca.  Dolor de cabeza.  Sequedad en la boca.  Hambre.  Irritabilidad.  Ansiedad.  Sueo agitado.  Alteracin del habla o de la coordinacin.  Confusin.  Tratar la hipoglucemia rpidamente. Si usted est alerta y puede tragar con seguridad, siga la regla de 15/15 que consiste en:  Tome entre 15 y 20gramos de glucosa de accin rpida o carbohidratos. Las opciones de accin rpida son un gel de glucosa, tabletas de glucosa, o 4 onzas (120 ml) de jugo de frutas, gaseosa comn, o leche baja en grasa.  Compruebe su nivel de glucosa en la sangre 15 minutos despus de tomar la glucosa.  Tome entre 15 y 20 gramos ms de glucosa si el nivel de glucosa en la sangre todava es de 70mg/dl o inferior.  Ingiera una comida o una colacin en el lapso de 1 hora una vez que los niveles de glucosa en la sangre vuelven   a la normalidad.  Est atento a la poliuria (miccin excesiva) y la polidipsia (sensacin de mucha sed), que son los primeros signos de la hiperglucemia. El reconocimiento temprano de la hiperglucemia permite un tratamiento oportuno. Trate la hiperglucemia segn le indic su mdico.  Haga actividad fsica por lo menos 30minutos al da o como lo indique su mdico. Se recomienda que 30 minutos despus de cada comida, realice diez minutos de actividad fsica para controlar los niveles de glucosa postprandial en la sangre.  Ajuste su dosis de insulina y la ingesta de alimentos, segn sea necesario, si inicia un nuevo ejercicio o deporte.  Siga su plan para los das de enfermedad cuando no pueda comer o beber como de costumbre.  Evite el tabaco y el  alcohol.  Concurra a todas las visitas de control como se lo haya indicado el mdico.  Siga el consejo del mdico respecto a los controles prenatales y posteriores al parto (postparto), las visitas, la planificacin de las comidas, el ejercicio, los medicamentos, las vitaminas, los anlisis de sangre, otras pruebas mdicas y actividades fsicas.  Realice diariamente el cuidado de la piel y de los pies. Examine su piel y los pies diariamente para ver si tiene cortes, moretones, enrojecimiento, problemas en las uas, sangrado, ampollas o llagas.  Cepllese los dientes y encas por lo menos dos veces al da y use hilo dental al menos una vez por da. Concurra regularmente a las visitas de control con el dentista.  Programe un examen de vista durante el primer trimestre de su embarazo o como lo indique su mdico.  Comparta su plan de control de diabetes en el trabajo o en la escuela.  Mantngase al da con las vacunas.  Aprenda a manejar el estrs.  Obtenga la mayor cantidad posible de informacin sobre la diabetes y solicite ayuda siempre que sea necesario.  Obtenga informacin sobre el amamantamiento y analice esta posibilidad.  Debe controlar el nivel de azcar en la sangre de 6a 12semanas despus del parto. Esto se hace con una prueba de tolerancia a la glucosa oral (PTGO). SOLICITE ATENCIN MDICA SI:   No puede comer alimentos o beber por ms de 6 horas.  Tuvo nuseas o ha vomitado durante ms de 6 horas.  Tiene un nivel de glucosa en la sangre de 200 mg/dl y cetonas en la orina.  Presenta algn cambio en el estado mental.  Desarrolla problemas de visin.  Sufre un dolor persistente de cabeza.  Siente dolor o molestias en la parte superior del abdomen.  Desarrolla una enfermedad grave adicional.  Tuvo diarrea durante ms de 6 horas.  Ha estado enfermo o ha tenido fiebre durante un par de das y no mejora. SOLICITE ATENCIN MDICA DE INMEDIATO SI:   Tiene dificultad  para respirar.  Ya no siente los movimientos del beb.  Est sangrando o tiene flujo vaginal.  Comienza a tener contracciones o trabajo de parto prematuro. ASEGRESE DE QUE:  Comprende estas instrucciones.  Controlar su afeccin.  Recibir ayuda de inmediato si no mejora o si empeora. Document Released: 05/08/2005 Document Revised: 12/13/2013 [employer] Patient Information 2015 [employer]. This information is not intended to replace advice given to you by your health care provider. Make sure you discuss any questions you have with your health care provider.  

## 2014-10-24 ENCOUNTER — Ambulatory Visit (INDEPENDENT_AMBULATORY_CARE_PROVIDER_SITE_OTHER): Payer: Self-pay | Admitting: Family Medicine

## 2014-10-24 VITALS — BP 118/69 | HR 81 | Temp 97.8°F | Wt 120.6 lb

## 2014-10-24 DIAGNOSIS — O24913 Unspecified diabetes mellitus in pregnancy, third trimester: Secondary | ICD-10-CM

## 2014-10-24 DIAGNOSIS — O0993 Supervision of high risk pregnancy, unspecified, third trimester: Secondary | ICD-10-CM

## 2014-10-24 LAB — POCT URINALYSIS DIP (DEVICE)
Bilirubin Urine: NEGATIVE
Glucose, UA: NEGATIVE mg/dL
HGB URINE DIPSTICK: NEGATIVE
Ketones, ur: NEGATIVE mg/dL
NITRITE: NEGATIVE
PH: 5.5 (ref 5.0–8.0)
PROTEIN: NEGATIVE mg/dL
Specific Gravity, Urine: 1.01 (ref 1.005–1.030)
UROBILINOGEN UA: 0.2 mg/dL (ref 0.0–1.0)

## 2014-10-24 NOTE — Progress Notes (Signed)
Doris Hendricks Spanish Interpreter for encounter No complaints today

## 2014-10-24 NOTE — Progress Notes (Signed)
Growth U/S 11/21/14 @ 915a with Radiology.

## 2014-10-24 NOTE — Progress Notes (Signed)
Patient is 29 y.o. G2P1001 1369w2d.  +FM, denies LOF, VB, contractions, vaginal discharge.  Overall feeling well. A1DM: fasting: 2/13 out of range = 96, 95 2h PP: breakfast 0/13; lunch 4/13 max 131; dinner 6/11 out of range 121-131 => discussed dinner meals, decrease intake of tortillas from 2-3 down to 1, decrease rice.  Advised may have to start medication if consistent raised in evening or if worsens. - growth sono for 38w

## 2014-10-31 ENCOUNTER — Ambulatory Visit (INDEPENDENT_AMBULATORY_CARE_PROVIDER_SITE_OTHER): Payer: Self-pay | Admitting: Family Medicine

## 2014-10-31 VITALS — BP 112/69 | HR 82 | Temp 97.6°F | Wt 119.2 lb

## 2014-10-31 DIAGNOSIS — O0993 Supervision of high risk pregnancy, unspecified, third trimester: Secondary | ICD-10-CM

## 2014-10-31 DIAGNOSIS — O24913 Unspecified diabetes mellitus in pregnancy, third trimester: Secondary | ICD-10-CM

## 2014-10-31 LAB — POCT URINALYSIS DIP (DEVICE)
Bilirubin Urine: NEGATIVE
Glucose, UA: NEGATIVE mg/dL
KETONES UR: NEGATIVE mg/dL
NITRITE: NEGATIVE
PH: 7 (ref 5.0–8.0)
PROTEIN: NEGATIVE mg/dL
Specific Gravity, Urine: 1.015 (ref 1.005–1.030)
Urobilinogen, UA: 0.2 mg/dL (ref 0.0–1.0)

## 2014-10-31 MED ORDER — GLYBURIDE 2.5 MG PO TABS: 2.5000 mg | ORAL_TABLET | Freq: Every day | ORAL | Status: DC

## 2014-10-31 NOTE — Progress Notes (Signed)
Patient is 29 y.o. G2P1001 5270w2d.  +FM, denies LOF, VB, contractions, vaginal discharge.  Overall feeling well.  Reports very hungry, feels like she can't eat enough. Fasting: 93, 81, 82, 79, 88, 77, 67 2h PP: 124, 64 91 76 82 103 95 2h PP lunch: 120 142 138 144 122 110 106  2h PP dinner: 107 118 111 119 120 123  ==> start glyburide 2.5mg  qAM, discussed diet Start antenatal testing as now A2DM

## 2014-10-31 NOTE — Progress Notes (Signed)
Blanca linder used as interpreter for this encounter.  

## 2014-11-02 ENCOUNTER — Ambulatory Visit (INDEPENDENT_AMBULATORY_CARE_PROVIDER_SITE_OTHER): Payer: Self-pay | Admitting: *Deleted

## 2014-11-02 VITALS — BP 115/65 | HR 82

## 2014-11-02 DIAGNOSIS — O24913 Unspecified diabetes mellitus in pregnancy, third trimester: Secondary | ICD-10-CM

## 2014-11-02 NOTE — Progress Notes (Signed)
Interpreter Blanca Lindner present for encounter 

## 2014-11-02 NOTE — Progress Notes (Signed)
NST performed today was reviewed and was found to be reactive.  Continue recommended antenatal testing and prenatal care.  

## 2014-11-07 ENCOUNTER — Ambulatory Visit (INDEPENDENT_AMBULATORY_CARE_PROVIDER_SITE_OTHER): Payer: Self-pay | Admitting: Obstetrics & Gynecology

## 2014-11-07 VITALS — BP 121/69 | HR 84 | Temp 97.5°F | Wt 122.0 lb

## 2014-11-07 DIAGNOSIS — O24913 Unspecified diabetes mellitus in pregnancy, third trimester: Secondary | ICD-10-CM

## 2014-11-07 DIAGNOSIS — O0993 Supervision of high risk pregnancy, unspecified, third trimester: Secondary | ICD-10-CM

## 2014-11-07 LAB — POCT URINALYSIS DIP (DEVICE)
Bilirubin Urine: NEGATIVE
Glucose, UA: NEGATIVE mg/dL
HGB URINE DIPSTICK: NEGATIVE
KETONES UR: NEGATIVE mg/dL
Nitrite: NEGATIVE
PH: 7 (ref 5.0–8.0)
PROTEIN: NEGATIVE mg/dL
SPECIFIC GRAVITY, URINE: 1.015 (ref 1.005–1.030)
Urobilinogen, UA: 0.2 mg/dL (ref 0.0–1.0)

## 2014-11-07 LAB — US OB FOLLOW UP

## 2014-11-07 LAB — OB RESULTS CONSOLE GC/CHLAMYDIA
Chlamydia: NEGATIVE
Gonorrhea: NEGATIVE

## 2014-11-07 LAB — OB RESULTS CONSOLE GBS: GBS: NEGATIVE

## 2014-11-07 NOTE — Patient Instructions (Signed)
Regrese a la clinica cuando tenga su cita. Si tiene problemas o preguntas, llama a la clinica o vaya a la [facility] al [facility].    

## 2014-11-07 NOTE — Progress Notes (Signed)
Doris Hendricks used for interpreter  Patient reports occasional pelvic pain

## 2014-11-07 NOTE — Progress Notes (Signed)
Patient is Spanish-speaking only, Spanish interpreter present for this encounter. On blood sugar review; a couple of postprandials in 130s but otherwise normal.  Continue Glyburide 2.5 mg po qam. NST performed today was reviewed and was found to be reactive.  AFI normal.  Continue recommended antenatal testing and prenatal care. Pelvic cultures done today.  Labor and fetal movement precautions reviewed.

## 2014-11-07 NOTE — Progress Notes (Signed)
Glucose:  Negative Protein:    Negative 

## 2014-11-08 LAB — GC/CHLAMYDIA PROBE AMP
CT PROBE, AMP APTIMA: NEGATIVE
GC Probe RNA: NEGATIVE

## 2014-11-09 LAB — CULTURE, BETA STREP (GROUP B ONLY)

## 2014-11-10 ENCOUNTER — Ambulatory Visit (INDEPENDENT_AMBULATORY_CARE_PROVIDER_SITE_OTHER): Payer: Self-pay | Admitting: *Deleted

## 2014-11-10 DIAGNOSIS — O24913 Unspecified diabetes mellitus in pregnancy, third trimester: Secondary | ICD-10-CM

## 2014-11-10 NOTE — Progress Notes (Signed)
NST reviewed and reactive.  Arleigh Dicola L. Harraway-Smith, M.D., FACOG    

## 2014-11-14 ENCOUNTER — Ambulatory Visit (INDEPENDENT_AMBULATORY_CARE_PROVIDER_SITE_OTHER): Payer: Self-pay | Admitting: Obstetrics & Gynecology

## 2014-11-14 VITALS — BP 108/69 | HR 70 | Wt 120.0 lb

## 2014-11-14 DIAGNOSIS — O24913 Unspecified diabetes mellitus in pregnancy, third trimester: Secondary | ICD-10-CM

## 2014-11-14 LAB — US OB FOLLOW UP

## 2014-11-14 LAB — POCT URINALYSIS DIP (DEVICE)
Bilirubin Urine: NEGATIVE
Glucose, UA: NEGATIVE mg/dL
Hgb urine dipstick: NEGATIVE
Ketones, ur: NEGATIVE mg/dL
Nitrite: NEGATIVE
Protein, ur: NEGATIVE mg/dL
Specific Gravity, Urine: 1.015 (ref 1.005–1.030)
Urobilinogen, UA: 0.2 mg/dL (ref 0.0–1.0)
pH: 7 (ref 5.0–8.0)

## 2014-11-14 NOTE — Progress Notes (Signed)
Patient is Spanish-speaking only, Spanish interpreter present for this encounter.  Reviewed negative pelvic culture results with patients. On blood sugar review; one dinner postprandials in 130s but otherwise normal. Continue Glyburide 2.5 mg po qam. NST performed today was reviewed and was found to be reactive. AFI normal at 10 cm. Continue recommended antenatal testing and prenatal care.  Growth scan next week, will also schedule IOL at 39 weeks.  Labor and fetal movement precautions reviewed.

## 2014-11-14 NOTE — Progress Notes (Signed)
Doris Hendricks used for interpreter 

## 2014-11-14 NOTE — Patient Instructions (Signed)
Regrese a la clinica cuando tenga su cita. Si tiene problemas o preguntas, llama a la clinica o vaya a la [facility] al [facility].    

## 2014-11-14 NOTE — Progress Notes (Signed)
Moderate Leukocytes in urine.  

## 2014-11-17 ENCOUNTER — Encounter (HOSPITAL_COMMUNITY): Payer: Self-pay | Admitting: *Deleted

## 2014-11-17 ENCOUNTER — Telehealth (HOSPITAL_COMMUNITY): Payer: Self-pay | Admitting: *Deleted

## 2014-11-17 NOTE — Telephone Encounter (Signed)
Interpreter number [id number] 

## 2014-11-17 NOTE — Telephone Encounter (Signed)
Preadmission screen  

## 2014-11-18 ENCOUNTER — Ambulatory Visit (INDEPENDENT_AMBULATORY_CARE_PROVIDER_SITE_OTHER): Payer: Self-pay | Admitting: *Deleted

## 2014-11-18 VITALS — BP 117/79 | HR 75

## 2014-11-18 DIAGNOSIS — O24913 Unspecified diabetes mellitus in pregnancy, third trimester: Secondary | ICD-10-CM

## 2014-11-18 NOTE — Progress Notes (Signed)
NST reviewed and reactive.  

## 2014-11-21 ENCOUNTER — Ambulatory Visit (INDEPENDENT_AMBULATORY_CARE_PROVIDER_SITE_OTHER): Payer: Self-pay | Admitting: Obstetrics & Gynecology

## 2014-11-21 ENCOUNTER — Ambulatory Visit (HOSPITAL_COMMUNITY)
Admission: RE | Admit: 2014-11-21 | Discharge: 2014-11-21 | Disposition: A | Discharge status: Home / Self Care | Payer: Self-pay | Source: Ambulatory Visit | Attending: Family Medicine | Admitting: Family Medicine

## 2014-11-21 ENCOUNTER — Other Ambulatory Visit: Payer: Self-pay | Admitting: Family Medicine

## 2014-11-21 VITALS — BP 125/81 | HR 81 | Wt 122.1 lb

## 2014-11-21 DIAGNOSIS — O24913 Unspecified diabetes mellitus in pregnancy, third trimester: Secondary | ICD-10-CM

## 2014-11-21 DIAGNOSIS — Z3A38 38 weeks gestation of pregnancy: Secondary | ICD-10-CM | POA: Insufficient documentation

## 2014-11-21 DIAGNOSIS — O24419 Gestational diabetes mellitus in pregnancy, unspecified control: Secondary | ICD-10-CM | POA: Insufficient documentation

## 2014-11-21 DIAGNOSIS — Z3689 Encounter for other specified antenatal screening: Secondary | ICD-10-CM | POA: Insufficient documentation

## 2014-11-21 DIAGNOSIS — O0993 Supervision of high risk pregnancy, unspecified, third trimester: Secondary | ICD-10-CM

## 2014-11-21 DIAGNOSIS — Z36 Encounter for antenatal screening of mother: Secondary | ICD-10-CM | POA: Insufficient documentation

## 2014-11-21 DIAGNOSIS — E119 Type 2 diabetes mellitus without complications: Secondary | ICD-10-CM

## 2014-11-21 DIAGNOSIS — O24113 Pre-existing diabetes mellitus, type 2, in pregnancy, third trimester: Secondary | ICD-10-CM

## 2014-11-21 LAB — POCT URINALYSIS DIP (DEVICE)
Bilirubin Urine: NEGATIVE
Glucose, UA: NEGATIVE mg/dL
HGB URINE DIPSTICK: NEGATIVE
Ketones, ur: NEGATIVE mg/dL
NITRITE: NEGATIVE
PH: 6.5 (ref 5.0–8.0)
PROTEIN: NEGATIVE mg/dL
SPECIFIC GRAVITY, URINE: 1.015 (ref 1.005–1.030)
UROBILINOGEN UA: 0.2 mg/dL (ref 0.0–1.0)

## 2014-11-21 NOTE — Patient Instructions (Signed)
Return to clinic for any obstetric concerns or go to [facility] for evaluation  

## 2014-11-21 NOTE — Progress Notes (Signed)
Patient is Spanish-speaking only, Spanish interpreter present for this encounter. Blood sugars within range. NST performed today was reviewed and was found to be reactive.  Growth scan scheduled today.  IOL on 4/16.  Return in 6 weeks for PP visit and 2 hr GTT. No other complaints or concerns.  Labor and fetal movement precautions reviewed.

## 2014-11-21 NOTE — Progress Notes (Signed)
Interpreter Maretta LosBlanca Lindner present for encounter.  US for growth today.  IOL scheduled 4/16

## 2014-11-21 NOTE — Progress Notes (Signed)
Spanish Interpreter Blanca Lindner 

## 2014-11-24 ENCOUNTER — Ambulatory Visit (INDEPENDENT_AMBULATORY_CARE_PROVIDER_SITE_OTHER): Payer: Self-pay | Admitting: *Deleted

## 2014-11-24 VITALS — BP 111/64 | HR 92

## 2014-11-24 DIAGNOSIS — O24913 Unspecified diabetes mellitus in pregnancy, third trimester: Secondary | ICD-10-CM

## 2014-11-24 NOTE — Progress Notes (Signed)
NST performed today was reviewed and was found to be reactive.  Continue recommended antenatal testing and prenatal care.  

## 2014-11-24 NOTE — Progress Notes (Signed)
Interpreter Alviris Almonte present for encounter. Sx of labor reviewed. Pt is scheduled for IOL on 4/16.

## 2014-11-26 ENCOUNTER — Inpatient Hospital Stay (HOSPITAL_COMMUNITY): Payer: Medicaid Other | Admitting: Anesthesiology

## 2014-11-26 ENCOUNTER — Inpatient Hospital Stay (HOSPITAL_COMMUNITY)
Admission: RE | Admit: 2014-11-26 | Discharge: 2014-11-26 | Disposition: A | Discharge status: Home / Self Care | Payer: Medicaid Other | Source: Ambulatory Visit | Attending: Obstetrics & Gynecology | Admitting: Obstetrics & Gynecology

## 2014-11-26 ENCOUNTER — Encounter (HOSPITAL_COMMUNITY): Payer: Self-pay | Admitting: *Deleted

## 2014-11-26 ENCOUNTER — Inpatient Hospital Stay (HOSPITAL_COMMUNITY)
Admission: AD | Admit: 2014-11-26 | Discharge: 2014-11-28 | DRG: 774 | Disposition: A | Discharge status: Home / Self Care | Payer: Medicaid Other | Source: Ambulatory Visit | Attending: Obstetrics & Gynecology | Admitting: Obstetrics & Gynecology

## 2014-11-26 DIAGNOSIS — Z8249 Family history of ischemic heart disease and other diseases of the circulatory system: Secondary | ICD-10-CM | POA: Diagnosis not present

## 2014-11-26 DIAGNOSIS — O322XX0 Maternal care for transverse and oblique lie, not applicable or unspecified: Secondary | ICD-10-CM | POA: Diagnosis present

## 2014-11-26 DIAGNOSIS — O2412 Pre-existing diabetes mellitus, type 2, in childbirth: Principal | ICD-10-CM | POA: Diagnosis present

## 2014-11-26 DIAGNOSIS — Z833 Family history of diabetes mellitus: Secondary | ICD-10-CM | POA: Diagnosis not present

## 2014-11-26 DIAGNOSIS — Z9889 Other specified postprocedural states: Secondary | ICD-10-CM | POA: Diagnosis present

## 2014-11-26 DIAGNOSIS — Z3A39 39 weeks gestation of pregnancy: Secondary | ICD-10-CM | POA: Diagnosis present

## 2014-11-26 DIAGNOSIS — E119 Type 2 diabetes mellitus without complications: Secondary | ICD-10-CM | POA: Diagnosis present

## 2014-11-26 LAB — TYPE AND SCREEN
ABO/RH(D): A POS
Antibody Screen: NEGATIVE

## 2014-11-26 LAB — GLUCOSE, CAPILLARY
GLUCOSE-CAPILLARY: 66 mg/dL — AB (ref 70–99)
GLUCOSE-CAPILLARY: 94 mg/dL (ref 70–99)
GLUCOSE-CAPILLARY: 50 mg/dL — AB (ref 70–99)
Glucose-Capillary: 91 mg/dL (ref 70–99)
Glucose-Capillary: 108 mg/dL — ABNORMAL HIGH (ref 70–99)
Glucose-Capillary: 90 mg/dL (ref 70–99)

## 2014-11-26 LAB — CBC
HCT: 36.7 % (ref 36.0–46.0)
Hemoglobin: 12.7 g/dL (ref 12.0–15.0)
MCH: 29.1 pg (ref 26.0–34.0)
MCHC: 34.6 g/dL (ref 30.0–36.0)
MCV: 84.2 fL (ref 78.0–100.0)
PLATELETS: 205 10*3/uL (ref 150–400)
RBC: 4.36 MIL/uL (ref 3.87–5.11)
RDW: 15.4 % (ref 11.5–15.5)
WBC: 7.3 10*3/uL (ref 4.0–10.5)

## 2014-11-26 MED ORDER — FENTANYL 2.5 MCG/ML BUPIVACAINE 1/10 % EPIDURAL INFUSION (WH - ANES)
INTRAMUSCULAR | Status: DC | PRN
  Administered 2014-11-26: 14 mL/h via EPIDURAL

## 2014-11-26 MED ORDER — LACTATED RINGERS IV SOLN
INTRAVENOUS | Status: DC
  Administered 2014-11-26 (×2): via INTRAVENOUS

## 2014-11-26 MED ORDER — DIPHENHYDRAMINE HCL 50 MG/ML IJ SOLN: 12.5000 mg | INTRAMUSCULAR | Status: DC | PRN

## 2014-11-26 MED ORDER — TERBUTALINE SULFATE 1 MG/ML IJ SOLN: 0.2500 mg | Freq: Once | INTRAMUSCULAR | Status: AC | PRN

## 2014-11-26 MED ORDER — LIDOCAINE HCL (PF) 1 % IJ SOLN
30.0000 mL | INTRAMUSCULAR | Status: DC | PRN
  Filled 2014-11-26 (×2): qty 30

## 2014-11-26 MED ORDER — FLEET ENEMA 7-19 GM/118ML RE ENEM: 1.0000 | ENEMA | RECTAL | Status: DC | PRN

## 2014-11-26 MED ORDER — OXYCODONE-ACETAMINOPHEN 5-325 MG PO TABS: 1.0000 | ORAL_TABLET | ORAL | Status: DC | PRN

## 2014-11-26 MED ORDER — PHENYLEPHRINE 40 MCG/ML (10ML) SYRINGE FOR IV PUSH (FOR BLOOD PRESSURE SUPPORT)
80.0000 ug | PREFILLED_SYRINGE | INTRAVENOUS | Status: DC | PRN
  Filled 2014-11-26: qty 2

## 2014-11-26 MED ORDER — FENTANYL 2.5 MCG/ML BUPIVACAINE 1/10 % EPIDURAL INFUSION (WH - ANES)
14.0000 mL/h | INTRAMUSCULAR | Status: DC | PRN
  Filled 2014-11-26: qty 125

## 2014-11-26 MED ORDER — ONDANSETRON HCL 4 MG/2ML IJ SOLN
4.0000 mg | Freq: Four times a day (QID) | INTRAMUSCULAR | Status: DC | PRN
  Administered 2014-11-27: 4 mg via INTRAVENOUS
  Filled 2014-11-26: qty 2

## 2014-11-26 MED ORDER — CITRIC ACID-SODIUM CITRATE 334-500 MG/5ML PO SOLN: 30.0000 mL | ORAL | Status: DC | PRN

## 2014-11-26 MED ORDER — LACTATED RINGERS IV SOLN
500.0000 mL | INTRAVENOUS | Status: DC | PRN
  Administered 2014-11-26: 500 mL via INTRAVENOUS

## 2014-11-26 MED ORDER — OXYTOCIN 40 UNITS IN LACTATED RINGERS INFUSION - SIMPLE MED: 62.5000 mL/h | INTRAVENOUS | Status: DC

## 2014-11-26 MED ORDER — EPHEDRINE 5 MG/ML INJ
10.0000 mg | INTRAVENOUS | Status: DC | PRN
  Filled 2014-11-26: qty 2

## 2014-11-26 MED ORDER — MISOPROSTOL 25 MCG QUARTER TABLET
50.0000 ug | ORAL_TABLET | ORAL | Status: DC | PRN
  Administered 2014-11-26: 50 ug via ORAL
  Filled 2014-11-26 (×2): qty 0.5

## 2014-11-26 MED ORDER — LACTATED RINGERS IV SOLN: 500.0000 mL | Freq: Once | INTRAVENOUS | Status: DC

## 2014-11-26 MED ORDER — OXYTOCIN BOLUS FROM INFUSION: 500.0000 mL | INTRAVENOUS | Status: DC

## 2014-11-26 MED ORDER — ACETAMINOPHEN 325 MG PO TABS: 650.0000 mg | ORAL_TABLET | ORAL | Status: DC | PRN

## 2014-11-26 MED ORDER — LIDOCAINE HCL (PF) 1 % IJ SOLN
INTRAMUSCULAR | Status: DC | PRN
  Administered 2014-11-26: 8 mL

## 2014-11-26 MED ORDER — OXYCODONE-ACETAMINOPHEN 5-325 MG PO TABS: 2.0000 | ORAL_TABLET | ORAL | Status: DC | PRN

## 2014-11-26 MED ORDER — OXYTOCIN 40 UNITS IN LACTATED RINGERS INFUSION - SIMPLE MED
1.0000 m[IU]/min | INTRAVENOUS | Status: DC
  Administered 2014-11-26: 2 m[IU]/min via INTRAVENOUS
  Filled 2014-11-26: qty 1000

## 2014-11-26 MED ORDER — PHENYLEPHRINE 40 MCG/ML (10ML) SYRINGE FOR IV PUSH (FOR BLOOD PRESSURE SUPPORT)
80.0000 ug | PREFILLED_SYRINGE | INTRAVENOUS | Status: DC | PRN
  Filled 2014-11-26: qty 20
  Filled 2014-11-26: qty 2

## 2014-11-26 NOTE — H&P (Signed)
LABOR ADMISSION HISTORY AND PHYSICAL  Doris Hendricks is a 29 y.o. female G2P1001 with IUP at 936w0d here for IOL 2/2 A2DM on 2.5mg  glyburide qAM. She reports +FMs, No LOF, no VB, no blurry vision, headaches or peripheral edema, and RUQ pain.  She plans on breast feeding. She request IUD for birth control.  Dating: By LMP --->  Estimated Date of Delivery: 12/03/14  Sono:    6838w2d, CWD, normal anatomy, cephalic presentation, 3017g (6lb 10oz),  45%ile  EFW   Prenatal History/Complications:  Clinic  High Risk Prenatal Labs  Dating  LMP Blood type:   A+  Genetic Screen  Quad: negative 06/30/14, @[facility]  Antibody: Negative  Anatomic US Normal Rubella:  immune  GTT Early: 169    Failed 3 hr - [phone],168,144  RPR:   negative  Flu vaccine 05/30/14 [facility]   HBsAg:   negative  TDaP vaccine  09/15/14 [facility]                HIV:   non-reactive  GBS Negative                                          GBS: negative  Contraception  IUD JWJ:XBJYNWGNPap:Negative 05/30/14  Baby Food  breast and bottle   Circumcision  n/a girl   Pediatrician  [facility]    Support Person Sister-in-law     Past Medical History: Past Medical History  Diagnosis Date  . Medical history non-contributory     Past Surgical History: Past Surgical History  Procedure Laterality Date  . No past surgeries      Obstetrical History: OB History    Gravida Para Term Preterm AB TAB SAB Ectopic Multiple Living   2 1 1  0 0 0 0 0 0 1      Social History: History   Social History  . Marital Status: Single    Spouse Name: N/A  . Number of Children: N/A  . Years of Education: N/A   Social History Main Topics  . Smoking status: Never Smoker   . Smokeless tobacco: Never Used  . Alcohol Use: No  . Drug Use: No  . Sexual Activity: Not Currently   Other Topics Concern  . None   Social History Narrative    Family History: Family History  Problem Relation Age of Onset  . Diabetes Mother   . Hypertension Mother     Allergies: No  Known Allergies  Prescriptions prior to admission  Medication Sig Dispense Refill Last Dose  . glyBURIDE (DIABETA) 2.5 MG tablet Take 1 tablet (2.5 mg total) by mouth daily with breakfast. 30 tablet 1 Taking  . prenatal vitamin w/FE, FA (PRENATAL 1 + 1) 27-1 MG TABS tablet Take 1 tablet by mouth daily at 12 noon.   Taking     Review of Systems   All systems reviewed and negative except as stated in HPI  Blood pressure 121/72, pulse 82, temperature 97.9 F (36.6 C), temperature source Oral, resp. rate 20, height 4' 11.5" (1.511 m), weight 123 lb (55.792 kg), last menstrual period 02/26/2014. General appearance: alert and cooperative Lungs: clear to auscultation bilaterally Heart: regular rate and rhythm Abdomen: soft, non-tender; bowel sounds normal Pelvic: adequate Extremities: Homans sign is negative, no sign of DVT DTR's 2+ Presentation: cephalic, confirmed on ultrasound Fetal monitoringBaseline: 145 bpm, Variability: Good {> 6 bpm), Accelerations: Reactive and Decelerations: Absent Uterine  activityNone Dilation: 1.5 Effacement (%): 50 Station: -3 Exam by:: Dr Loreta Ave BP 121/72 mmHg  Pulse 82  Temp(Src) 97.9 F (36.6 C) (Oral)  Resp 20  Ht 4' 11.5" (1.511 m)  Wt 123 lb (55.792 kg)  BMI 24.44 kg/m2  LMP 02/26/2014 (Exact Date)   Prenatal labs: ABO, Rh: A/Positive/-- (10/19 0000) Antibody: Negative (10/19 0000) Rubella:   RPR: Nonreactive (02/04 0000)  HBsAg: Negative (10/19 0000)  HIV: Non-reactive (10/19 0000)  GBS: Negative (03/28 0000)  3 hr Glucola failed (79, 191, 168, 144) Genetic screening  Negative  Anatomy US normal   Prenatal Transfer Tool  Maternal Diabetes: Yes:  Diabetes Type:  Insulin/Medication controlled (glyburide) Genetic Screening: Normal Maternal Ultrasounds/Referrals: Normal Fetal Ultrasounds or other Referrals:  None Maternal Substance Abuse:  No Significant Maternal Medications:  None Significant Maternal Lab Results: Lab values  include: Other: 3hr GTT [phone])   Results for orders placed or performed during the hospital encounter of 11/26/14 (from the past 24 hour(s))  CBC   Collection Time: 11/26/14  9:00 AM  Result Value Ref Range   WBC 7.3 4.0 - 10.5 K/uL   RBC 4.36 3.87 - 5.11 MIL/uL   Hemoglobin 12.7 12.0 - 15.0 g/dL   HCT 82.9 56.2 - 13.0 %   MCV 84.2 78.0 - 100.0 fL   MCH 29.1 26.0 - 34.0 pg   MCHC 34.6 30.0 - 36.0 g/dL   RDW 86.5 78.4 - 69.6 %   Platelets 205 150 - 400 K/uL    Patient Active Problem List   Diagnosis Date Noted  . Status post induction of labor 11/26/2014  . Encounter for fetal anatomic survey   . [redacted] weeks gestation of pregnancy   . Diabetes mellitus affecting pregnancy in third trimester, antepartum 10/10/2014  . Supervision of high-risk pregnancy 10/04/2014    Assessment: Doris Hendricks is a 29 y.o. G2P1001 at [redacted]w[redacted]d here for IOL 2/2 A2DM  #Labor: FB placed at 1000, PO cytotec as well #Pain: Epidural upon request once FB out #FWB: Cat I #ID: GBS negative  #MOF: breast and bottle #MOC:IUD #Circ:  N/a, female #A2DM: hold glyburide, q2h CBGs, carb modified diet - low suspicion of macrosomia, leopold 7+lbs, hx of 7lb child, minimal weight gain in pregnancy (<10lb weight gain 3rd trimester)  Doris Hendricks ROCIO 11/26/2014, 10:17 AM

## 2014-11-26 NOTE — Anesthesia Procedure Notes (Signed)
Epidural Patient location during procedure: OB Start time: 11/26/2014 7:07 PM End time: 11/26/2014 7:25 PM  Staffing Anesthesiologist: Sebastian AcheMANNY, Lavonn Maxcy Performed by: anesthesiologist   Preanesthetic Checklist Completed: patient identified, site marked, surgical consent, pre-op evaluation, timeout performed, IV checked, risks and benefits discussed and monitors and equipment checked  Epidural Patient position: sitting Prep: site prepped and draped and DuraPrep Patient monitoring: heart rate, continuous pulse ox and blood pressure Approach: midline Location: L4-L5 Injection technique: LOR saline  Needle:  Needle type: Tuohy  Needle gauge: 17 G Needle length: 9 cm and 9 Needle insertion depth: 4.3 cm Catheter type: closed end flexible Catheter size: 19 Gauge Test dose: negative  Assessment Events: blood not aspirated, injection not painful, no injection resistance, negative IV test and no paresthesia  Additional Notes   Patient tolerated the insertion well without complications.Reason for block:procedure for pain

## 2014-11-26 NOTE — Progress Notes (Signed)
Labor Progress Note  S: uncomfortable  O:  BP 111/67 mmHg  Pulse 82  Temp(Src) 98.3 F (36.8 C) (Oral)  Resp 18  Ht 4' 11.5" (1.511 m)  Wt 123 lb (55.792 kg)  BMI 24.44 kg/m2  LMP 02/26/2014 (Exact Date) Cat I CVE: FB still in place (palpated on SVE)  A&P: 29 y.o. G2P1001 6184w0d here for IOL 2/2 A2DM well controlled # start pitocin, contracting too frequently to continue cytotec   Perry MountACOSTA,Pritesh Sobecki ROCIO, MD 6:07 PM

## 2014-11-26 NOTE — Progress Notes (Signed)
Labor Progress Note  S: comfortable with epidural  O:  BP 112/77 mmHg  Pulse 67  Temp(Src) 98.5 F (36.9 C) (Oral)  Resp 18  Ht 4' 11.5" (1.511 m)  Wt 123 lb (55.792 kg)  BMI 24.44 kg/m2  SpO2 100%  LMP 02/26/2014 (Exact Date) Cat I CVE: 4/70/-2, SROM during exam at which time both hands were palpated.  One hand swept away  A&P: 29 y.o. G2P1001 4259w0d here for IOL 2/2 A2DM well controlled # continue pitocin # A2DM: continue monitoring CBGs, well controlled with 1 hypoglycemic # hand presentation: expectant mgmt   Perry Hendricks,Doris Seabolt ROCIO, MD 9:20 PM

## 2014-11-26 NOTE — Anesthesia Preprocedure Evaluation (Signed)
Anesthesia Evaluation  Patient identified by MRN, date of birth, ID band Patient awake and Patient confused    Reviewed: Allergy & Precautions, H&P , NPO status , Patient's Chart, lab work & pertinent test results  Airway Mallampati: II   Neck ROM: full    Dental  (+) Teeth Intact   Pulmonary neg pulmonary ROS,  breath sounds clear to auscultation  Pulmonary exam normal       Cardiovascular Exercise Tolerance: Good negative cardio ROS  Rhythm:regular Rate:Normal     Neuro/Psych    GI/Hepatic   Endo/Other  diabetes, Gestational  Renal/GU      Musculoskeletal   Abdominal Normal abdominal exam  (+)   Peds  Hematology   Anesthesia Other Findings   Reproductive/Obstetrics (+) Pregnancy                             Anesthesia Physical Anesthesia Plan  ASA: II  Anesthesia Plan: Epidural   Post-op Pain Management:    Induction:   Airway Management Planned:   Additional Equipment:   Intra-op Plan:   Post-operative Plan:   Informed Consent: I have reviewed the patients History and Physical, chart, labs and discussed the procedure including the risks, benefits and alternatives for the proposed anesthesia with the patient or authorized representative who has indicated his/her understanding and acceptance.     Plan Discussed with:   Anesthesia Plan Comments:         Anesthesia Quick Evaluation

## 2014-11-27 ENCOUNTER — Encounter (HOSPITAL_COMMUNITY): Payer: Self-pay | Admitting: *Deleted

## 2014-11-27 DIAGNOSIS — Z3A39 39 weeks gestation of pregnancy: Secondary | ICD-10-CM

## 2014-11-27 DIAGNOSIS — E119 Type 2 diabetes mellitus without complications: Secondary | ICD-10-CM

## 2014-11-27 DIAGNOSIS — O2412 Pre-existing diabetes mellitus, type 2, in childbirth: Secondary | ICD-10-CM

## 2014-11-27 LAB — GLUCOSE, CAPILLARY
GLUCOSE-CAPILLARY: 74 mg/dL (ref 70–99)
GLUCOSE-CAPILLARY: 90 mg/dL (ref 70–99)

## 2014-11-27 LAB — RPR: RPR: NONREACTIVE

## 2014-11-27 MED ORDER — SENNOSIDES-DOCUSATE SODIUM 8.6-50 MG PO TABS
2.0000 | ORAL_TABLET | ORAL | Status: DC
  Administered 2014-11-27: 2 via ORAL
  Filled 2014-11-27: qty 2

## 2014-11-27 MED ORDER — SIMETHICONE 80 MG PO CHEW: 80.0000 mg | CHEWABLE_TABLET | ORAL | Status: DC | PRN

## 2014-11-27 MED ORDER — LANOLIN HYDROUS EX OINT: TOPICAL_OINTMENT | CUTANEOUS | Status: DC | PRN

## 2014-11-27 MED ORDER — TETANUS-DIPHTH-ACELL PERTUSSIS 5-2.5-18.5 LF-MCG/0.5 IM SUSP: 0.5000 mL | Freq: Once | INTRAMUSCULAR | Status: DC

## 2014-11-27 MED ORDER — ONDANSETRON HCL 4 MG/2ML IJ SOLN: 4.0000 mg | INTRAMUSCULAR | Status: DC | PRN

## 2014-11-27 MED ORDER — OXYCODONE-ACETAMINOPHEN 5-325 MG PO TABS: 2.0000 | ORAL_TABLET | ORAL | Status: DC | PRN

## 2014-11-27 MED ORDER — WITCH HAZEL-GLYCERIN EX PADS: 1.0000 "application " | MEDICATED_PAD | CUTANEOUS | Status: DC | PRN

## 2014-11-27 MED ORDER — DIPHENHYDRAMINE HCL 25 MG PO CAPS: 25.0000 mg | ORAL_CAPSULE | Freq: Four times a day (QID) | ORAL | Status: DC | PRN

## 2014-11-27 MED ORDER — BENZOCAINE-MENTHOL 20-0.5 % EX AERO: 1.0000 "application " | INHALATION_SPRAY | CUTANEOUS | Status: DC | PRN

## 2014-11-27 MED ORDER — OXYCODONE-ACETAMINOPHEN 5-325 MG PO TABS: 1.0000 | ORAL_TABLET | ORAL | Status: DC | PRN

## 2014-11-27 MED ORDER — ZOLPIDEM TARTRATE 5 MG PO TABS: 5.0000 mg | ORAL_TABLET | Freq: Every evening | ORAL | Status: DC | PRN

## 2014-11-27 MED ORDER — PRENATAL MULTIVITAMIN CH
1.0000 | ORAL_TABLET | Freq: Every day | ORAL | Status: DC
  Administered 2014-11-27: 1 via ORAL
  Filled 2014-11-27: qty 1

## 2014-11-27 MED ORDER — IBUPROFEN 600 MG PO TABS
600.0000 mg | ORAL_TABLET | Freq: Four times a day (QID) | ORAL | Status: DC
  Administered 2014-11-27 – 2014-11-28 (×5): 600 mg via ORAL
  Filled 2014-11-27 (×5): qty 1

## 2014-11-27 MED ORDER — ONDANSETRON HCL 4 MG PO TABS: 4.0000 mg | ORAL_TABLET | ORAL | Status: DC | PRN

## 2014-11-27 MED ORDER — DIBUCAINE 1 % RE OINT: 1.0000 "application " | TOPICAL_OINTMENT | RECTAL | Status: DC | PRN

## 2014-11-27 MED ORDER — ACETAMINOPHEN 325 MG PO TABS: 650.0000 mg | ORAL_TABLET | ORAL | Status: DC | PRN

## 2014-11-27 NOTE — Progress Notes (Signed)
Checked on patients needs and ordered snack.  Also assisted RN with interpretation of patient assessment. Spanish Interpreter

## 2014-11-27 NOTE — Progress Notes (Signed)
LABOR PROGRESS NOTE  Doris Hendricks is a 29 y.o. G2P1001 at 3869w1d  admitted for IOL 2/2 A2DM  Subjective: Patient sleeping, NAD  Objective: BP 115/73 mmHg  Pulse 83  Temp(Src) 98.4 F (36.9 C) (Oral)  Resp 16  Ht 4' 11.5" (1.511 m)  Wt 123 lb (55.792 kg)  BMI 24.44 kg/m2  SpO2 100%  LMP 02/26/2014 (Exact Date) or  Filed Vitals:   11/27/14 0031 11/27/14 0101 11/27/14 0131 11/27/14 0201  BP: 111/75 111/75 119/83 115/73  Pulse: 77 80 92 83  Temp:    98.4 F (36.9 C)  TempSrc:    Oral  Resp:    16  Height:      Weight:      SpO2:       Dilation: 7.5 Effacement (%): 100 Cervical Position: Middle (left) Station: -2 Presentation: Vertex Exam by:: A. Tuttle, RNC   Fetal monitoringBaseline: 140 bpm, Variability: Good {> 6 bpm) and Accelerations: Reactive Uterine activityFrequency: Every 1-5 minutes  Labs: Lab Results  Component Value Date   WBC 7.3 11/26/2014   HGB 12.7 11/26/2014   HCT 36.7 11/26/2014   MCV 84.2 11/26/2014   PLT 205 11/26/2014    Patient Active Problem List   Diagnosis Date Noted  . Status post induction of labor 11/26/2014  . Encounter for fetal anatomic survey   . [redacted] weeks gestation of pregnancy   . Diabetes mellitus affecting pregnancy in third trimester, antepartum 10/10/2014  . Supervision of high-risk pregnancy 10/04/2014    Assessment / Plan: 29 y.o. G2P1001 at 4169w1d here for IOL 2/2 A2DM  Labor: progressing well on Pit Fetal Wellbeing:  Cat 1 Pain Control:  none Anticipated MOD:  NSVD  Raliegh IpGottschalk, Parker Sawatzky M, DO 11/27/2014, 2:29 AM

## 2014-11-27 NOTE — Progress Notes (Signed)
Admission teaching done with interpreter assistance

## 2014-11-27 NOTE — Anesthesia Postprocedure Evaluation (Signed)
  Anesthesia Post-op Note  Patient: Doris Hendricks  Procedure(s) Performed: * No procedures listed *  Patient Location: Mother/Baby  Anesthesia Type:Epidural  Level of Consciousness: awake  Airway and Oxygen Therapy: Patient Spontanous Breathing  Post-op Pain: mild  Post-op Assessment: Patient's Cardiovascular Status Stable and Respiratory Function Stable  Post-op Vital Signs: stable  Last Vitals:  Filed Vitals:   11/27/14 1140  BP: 117/62  Pulse: 82  Temp: 37.1 C  Resp: 18    Complications: No apparent anesthesia complications

## 2014-11-27 NOTE — Progress Notes (Signed)
Checked on patients needs.  °Spanish interpreter  °

## 2014-11-27 NOTE — Progress Notes (Signed)
Checked on patients needs and ordered dinner and breakfast for patient.  Spanish Interpreter

## 2014-11-27 NOTE — Progress Notes (Signed)
Checked on patients needs.  °Spanish Interpreter  °

## 2014-11-27 NOTE — Progress Notes (Signed)
Assisted RN and anaesthesiologist with interpretation of epi procedure.  Spanish Interpreter

## 2014-11-27 NOTE — Progress Notes (Signed)
Patient ambulated to bathroom.  Tolerated well.  Voided and peri care performed.  Ambulated back to bed on her own.  No complaints.

## 2014-11-28 LAB — GLUCOSE, RANDOM: GLUCOSE: 96 mg/dL (ref 70–99)

## 2014-11-28 MED ORDER — OXYCODONE-ACETAMINOPHEN 5-325 MG PO TABS: 1.0000 | ORAL_TABLET | ORAL | Status: DC | PRN

## 2014-11-28 MED ORDER — IBUPROFEN 600 MG PO TABS
600.0000 mg | ORAL_TABLET | Freq: Four times a day (QID) | ORAL | Status: DC
Start: 2014-11-28 — End: 2021-11-21

## 2014-11-28 NOTE — Progress Notes (Signed)
Ur chart review completed.  

## 2014-11-28 NOTE — Lactation Note (Addendum)
This note was copied from the chart of Doris Yulisa Hendricks. Lactation Consultation Note Mom is sleeping, RN in room, stating she fed baby a bottle d/t mom was exhausted and hasn't slept much and didn't want to be distrubed. RN stated she has been BF well and she BF her others w/o difficulty. Patient Name: Doris Hendricks [id number]'s Date: 11/28/2014     Maternal Data    Feeding Feeding Type: Breast Fed  LATCH Score/Interventions                      Lactation Tools Discussed/Used     Consult Status      Charyl DancerCARVER, Nathali Vent G 11/28/2014, 7:16 AM

## 2014-11-28 NOTE — Progress Notes (Signed)
Patient did not wish to order lunch. Doris Hendricks  Interpreter.

## 2014-11-28 NOTE — Discharge Summary (Signed)
Obstetric Discharge Summary Reason for Admission: induction of labor Prenatal Procedures: ultrasound Intrapartum Procedures: spontaneous vaginal delivery Postpartum Procedures: none Complications-Operative and Postpartum: none HEMOGLOBIN  Date Value Ref Range Status  11/26/2014 12.7 12.0 - 15.0 g/dL Final  16/10/960402/11/2014 54.011.0 g/dL Final   HCT  Date Value Ref Range Status  11/26/2014 36.7 36.0 - 46.0 % Final  09/15/2014 35 % Final    Physical Exam:  General: alert, cooperative, appears stated age and no distress Lochia: appropriate Uterine Fundus: firm Incision: n/a DVT Evaluation: No evidence of DVT seen on physical exam. Negative Homan's sign. No cords or calf tenderness.  Discharge Diagnoses: Term Pregnancy-delivered and GDM  Discharge Information: Date: 11/28/2014 Activity: pelvic rest Diet: routine Medications: PNV, Ibuprofen and Percocet Condition: stable and improved Instructions: refer to practice specific booklet Discharge to: home   Newborn Data: Live born female  Birth Weight: 6 lb 11.9 oz (3060 g) APGAR: 8, 9  Home with mother.  Doris DuskyLAWSON, Katrece Roediger Doris Hendricks 11/28/2014, 7:21 AM

## 2014-11-30 ENCOUNTER — Encounter: Payer: Self-pay | Admitting: Obstetrics & Gynecology

## 2015-01-06 ENCOUNTER — Ambulatory Visit (INDEPENDENT_AMBULATORY_CARE_PROVIDER_SITE_OTHER): Payer: Self-pay | Admitting: Obstetrics & Gynecology

## 2015-01-06 DIAGNOSIS — Z8632 Personal history of gestational diabetes: Secondary | ICD-10-CM

## 2015-01-06 DIAGNOSIS — N3000 Acute cystitis without hematuria: Secondary | ICD-10-CM

## 2015-01-06 DIAGNOSIS — Z3009 Encounter for other general counseling and advice on contraception: Secondary | ICD-10-CM

## 2015-01-06 LAB — POCT URINALYSIS DIP (DEVICE)
Bilirubin Urine: NEGATIVE
Glucose, UA: NEGATIVE mg/dL
HGB URINE DIPSTICK: NEGATIVE
KETONES UR: NEGATIVE mg/dL
Nitrite: NEGATIVE
Protein, ur: NEGATIVE mg/dL
Specific Gravity, Urine: 1.015 (ref 1.005–1.030)
UROBILINOGEN UA: 0.2 mg/dL (ref 0.0–1.0)
pH: 6 (ref 5.0–8.0)

## 2015-01-06 MED ORDER — CEPHALEXIN 500 MG PO CAPS: 500.0000 mg | ORAL_CAPSULE | Freq: Three times a day (TID) | ORAL | Status: DC

## 2015-01-06 NOTE — Patient Instructions (Signed)
Regrese a la clinica cuando tenga su cita. Si tiene problemas o preguntas, llama a la clinica o vaya a la [facility] al [facility].    

## 2015-01-06 NOTE — Progress Notes (Signed)
    Subjective:     Doris Hendricks is a 29 y.o. 172P2002 female who presents for a postpartum visit.  Patient is Spanish-speaking only, Spanish interpreter present for this encounter. She is 6 weeks postpartum following a spontaneous vaginal delivery. I have fully reviewed the prenatal and intrapartum course. The delivery was at 39.1 gestational weeks; pregnancy significant for A2GDM on glyburide. Anesthesia: epidural. Postpartum course has been uncomplicated. Baby's course has been uncomplicated. Baby is feeding by both breast and bottle - Enfamil with Iron. Bleeding no bleeding. Bowel function is abnormal: constipation. Bladder function is abnormal - has abdominal pain and dysuria, no fevers for the past 3 days.  Patient is not sexually active. Contraception method [facility], will use condoms in the interim . Postpartum depression screening: negative.  The following portions of the patient's history were reviewed and updated as appropriate: allergies, current medications, past family history, past medical history, past social history, past surgical history and problem list.  Review of Systems Pertinent items are noted in HPI.    Objective:    BP 106/70 mmHg  Pulse 91  Wt 116 lb 1.6 oz (52.663 kg)  Breastfeeding? Yes  General:  alert and no distress   Breasts:  inspection negative, no nipple discharge or bleeding, no masses or nodularity palpable  Lungs: clear to auscultation bilaterally  Heart:  regular rate and rhythm  Abdomen: soft, non-tender; bowel sounds normal; no masses,  no organomegaly   Vulva:  normal  Vagina: normal vagina, no discharge, exudate, lesion, or erythema  Cervix:  multiparous appearance  Corpus: normal size, contour, position, consistency, mobility, non-tender  Adnexa:  normal adnexa and no mass, fullness, tenderness  Rectal Exam: Not performed.        Assessment:   Normal postpartum exam. Pap smear not done at today's visit.     UTI symptoms  Plan:   1. Contraception: condoms and IUD 2. 2 hr GTT done today, will follow up results 3. Follow up as needed. Routine preventative health maintenance measures emphasized.   Jaynie CollinsUGONNA  Devona Holmes, MD, FACOG Attending Obstetrician & Gynecologist Faculty Practice, [facility]

## 2015-01-07 LAB — GLUCOSE TOLERANCE, 2 HOURS
GLUCOSE, FASTING: 79 mg/dL (ref 70–99)
Glucose, 2 hour: 98 mg/dL (ref 70–139)

## 2015-01-08 LAB — URINE CULTURE: Colony Count: 75000

## 2015-01-10 ENCOUNTER — Telehealth: Payer: Self-pay

## 2015-01-10 NOTE — Telephone Encounter (Signed)
Called patient with spanish interpreter Doris Hendricks to inform her of passing 2hr gtt. Informed her of results. Patient verbalized understanding. No questions or concerns.

## 2021-08-12 NOTE — L&D Delivery Note (Addendum)
LABOR COURSE Patient came into MAU at 3/90/-2 with regular contractions, no LOF, no vaginal bleeding. Contractions started at 0400 on 04/01/22. Patient quickly progressed and found to be 7.5/90/0 prior to being transferred to L&D. When she arrived on L&D she was 9.5/100/+2 with anterior lip. With two rounds of pushing, lip resolved and was complete.  Delivery Note Called to room and patient was complete and pushing. Head delivered ROA. Nuchal cord present but not reducible while head was at the perineum. Shoulder and body delivered in usual fashion and baby somersaulted to resolve nuchal cord and placed on maternal abdomen. At  1413 a viable and healthy female was delivered via Vaginal, Spontaneous (Presentation: ROA ).  Infant with spontaneous cry, placed on mother's abdomen, dried and stimulated. Cord clamped x 2 after 1-minute delay, and cut by FOB. Cord blood drawn. Placenta delivered spontaneously with gentle cord traction. Appears intact. Fundus firm with massage and Pitocin. Labia, perineum, vagina, and cervix inspected with 1st degree perineal laceration that was repaired with 3-0 and 4-0 vicryl in the usual fashion.    APGAR: 8,9 ; weight  .   Cord: 3VC with no complications  Anesthesia: Lidocaine  Episiotomy: None Lacerations: 1st degree perineal Suture Repair: 3.0 vicryl Est. Blood Loss (mL): 200   Mom to postpartum.  Baby to Nursery.  Janise Gora, Cyndra Numbers, MD @TODAY @ 3:17 PM

## 2021-10-25 LAB — OB RESULTS CONSOLE HGB/HCT, BLOOD
HCT: 35 (ref 29–41)
HCT: 35 (ref 29–41)
Hemoglobin: 11.4
Hemoglobin: 11.4

## 2021-10-25 LAB — HEPATITIS C ANTIBODY
HCV Ab: NEGATIVE
HCV Ab: NEGATIVE

## 2021-10-25 LAB — OB RESULTS CONSOLE HEPATITIS B SURFACE ANTIGEN
Hepatitis B Surface Ag: NEGATIVE
Hepatitis B Surface Ag: NEGATIVE

## 2021-10-25 LAB — CYTOLOGY - PAP
Pap: NEGATIVE
Pap: NEGATIVE

## 2021-10-25 LAB — OB RESULTS CONSOLE GC/CHLAMYDIA
Chlamydia: NEGATIVE
Chlamydia: NEGATIVE
Gonorrhea: NEGATIVE
Gonorrhea: NEGATIVE

## 2021-10-25 LAB — OB RESULTS CONSOLE RUBELLA ANTIBODY, IGM
Rubella: IMMUNE
Rubella: IMMUNE

## 2021-10-25 LAB — OB RESULTS CONSOLE HIV ANTIBODY (ROUTINE TESTING)
HIV: NONREACTIVE
HIV: NONREACTIVE

## 2021-10-25 LAB — OB RESULTS CONSOLE PLATELET COUNT
Platelets: 268
Platelets: 268

## 2021-10-25 LAB — OB RESULTS CONSOLE ABO/RH: RH Type: POSITIVE

## 2021-10-25 LAB — OB RESULTS CONSOLE RPR
RPR: NONREACTIVE
RPR: NONREACTIVE

## 2021-10-25 LAB — OB RESULTS CONSOLE VARICELLA ZOSTER ANTIBODY, IGG: Varicella: IMMUNE

## 2021-10-25 LAB — OB RESULTS CONSOLE ANTIBODY SCREEN: Antibody Screen: NEGATIVE

## 2021-10-25 LAB — GLUCOSE TOLERANCE, 1 HOUR
Glucose, 1 Hour GTT: 155
Glucose, GTT - 1 Hour: 155 (ref ?–200)

## 2021-10-31 LAB — GLUCOSE TOLERANCE, 3 HOURS
Glucose, GTT - 1 Hour: 191 (ref ?–200)
Glucose, GTT - 2 Hour: 162 — AB (ref ?–140)
Glucose, GTT - 3 Hour: 142 mg/dL — AB (ref ?–140)
Glucose, GTT - Fasting: 93 mg/dL (ref 80–110)

## 2021-11-08 ENCOUNTER — Other Ambulatory Visit: Payer: Self-pay | Admitting: *Deleted

## 2021-11-08 DIAGNOSIS — O99810 Abnormal glucose complicating pregnancy: Secondary | ICD-10-CM

## 2021-11-15 ENCOUNTER — Encounter: Payer: Self-pay | Attending: Obstetrics & Gynecology | Admitting: Registered"

## 2021-11-15 ENCOUNTER — Ambulatory Visit (INDEPENDENT_AMBULATORY_CARE_PROVIDER_SITE_OTHER): Payer: Self-pay | Admitting: Registered"

## 2021-11-15 DIAGNOSIS — O99810 Abnormal glucose complicating pregnancy: Secondary | ICD-10-CM | POA: Insufficient documentation

## 2021-11-15 DIAGNOSIS — Z3A00 Weeks of gestation of pregnancy not specified: Secondary | ICD-10-CM | POA: Insufficient documentation

## 2021-11-15 NOTE — Progress Notes (Signed)
In-person interpreter Merrily [employer] Contract Interpreter ? ?Patient was seen for Gestational Diabetes self-management on 11/15/21  ? ?Start time 1020 and End time 1115  ? ?New patient referred from [facility] ?Per referral 6th grade education. Patient appeared to understand handout ?Estimated due date: 04/12/2022 ? ?Clinical: ?Medications: reviewed ?Medical History: prior GDM controlled with oral medication ?Labs: 1-hr GTT 155 mg/dL, A1c n/a%  ? ?Dietary and Lifestyle History: ?Pt states she has eliminated carbs from some of her meals. Pt reports that she will eat most of her carbs such as tortillas with her afternoon meal.  ? ?Physical Activity: 2x/week 40 min ?Stress: not assessed ?Sleep: not assessed ? ?24 hr Recall:  ?First Meal: whole wheat pita bread, egg, lettuce, tomato, avocado, 1 c green juice: green apple, celery, 4 pineapple, water ?Snack: fruit or cucumbers ?Second meal: 3 tostadas, beans, cheese, lettuce, ?Snack: fruit ?Third meal: chicken, vegetables ?Snack: ?Beverages: water ? ?NUTRITION INTERVENTION  ?Nutrition education (E-1) on the following topics:  ? ?Initial Follow-up ? ?[x]  []  Definition of Gestational Diabetes ?[]  []  Why dietary management is important in controlling blood glucose ?[x]  []  Effects each nutrient has on blood glucose levels ?[x]  []  Simple carbohydrates vs complex carbohydrates ?[x]  []  Fluid intake ?[x]  []  Creating a balanced meal plan ?[x]  []  Carbohydrate counting  ?[x]  []  When to check blood glucose levels ?[x]  []  Proper blood glucose monitoring techniques ?[x]  []  Effect of stress and stress reduction techniques  ?[x]  []  Exercise effect on blood glucose levels, appropriate exercise during pregnancy ?[]  []  Importance of limiting caffeine and abstaining from alcohol and smoking ?[x]  []  Medications used for blood sugar control during pregnancy ?[]  []  Hypoglycemia and rule of 15 ?[]  []  Postpartum self care ? ?Blood glucose monitor given: Prodigy ?Lot # [id number] ?CBG: 95 mg/dL   ? ?Patient instructed to monitor glucose levels: ?FBS: 60 - ? 95 mg/dL (some clinics use 90 for cutoff) ?1 hour: ? 140 mg/dL ?2 hour: ? 120 mg/dL ? ?Patient received handouts: ?Nutrition Diabetes and Pregnancy ?Carbohydrate Counting List ? ?Patient will be seen for follow-up as needed.  ?

## 2021-11-21 ENCOUNTER — Encounter: Payer: Self-pay | Admitting: Family Medicine

## 2021-11-21 ENCOUNTER — Ambulatory Visit (INDEPENDENT_AMBULATORY_CARE_PROVIDER_SITE_OTHER): Payer: Self-pay | Admitting: Family Medicine

## 2021-11-21 DIAGNOSIS — O09529 Supervision of elderly multigravida, unspecified trimester: Secondary | ICD-10-CM | POA: Insufficient documentation

## 2021-11-21 DIAGNOSIS — O09522 Supervision of elderly multigravida, second trimester: Secondary | ICD-10-CM

## 2021-11-21 DIAGNOSIS — O0990 Supervision of high risk pregnancy, unspecified, unspecified trimester: Secondary | ICD-10-CM

## 2021-11-21 DIAGNOSIS — O24419 Gestational diabetes mellitus in pregnancy, unspecified control: Secondary | ICD-10-CM | POA: Insufficient documentation

## 2021-11-21 DIAGNOSIS — O24410 Gestational diabetes mellitus in pregnancy, diet controlled: Secondary | ICD-10-CM

## 2021-11-21 MED ORDER — ASPIRIN EC 81 MG PO TBEC: 81.0000 mg | DELAYED_RELEASE_TABLET | Freq: Every day | ORAL | 11 refills | Status: DC

## 2021-11-21 NOTE — Patient Instructions (Signed)
Eleccin del mtodo anticonceptivo Contraception Choices La anticoncepcin, o los mtodos anticonceptivos, hace referencia a los mtodos o dispositivos que evitan el embarazo. Mtodos hormonales Implante anticonceptivo Un implante anticonceptivo consiste en un tubo delgado de plstico que contiene una hormona que evita el embarazo. Es diferente de un dispositivo intrauterino (DIU). Un mdico lo inserta en la parte superior del brazo. Los implantes pueden ser eficaces durante un mximo de 3 aos. Inyecciones de progestina sola Las inyecciones de progestina sola contienen progestina, una forma sinttica de la hormona progesterona. Un mdico las administra cada 3 meses. Pldoras anticonceptivas Las pldoras anticonceptivas son pastillas que contienen hormonas que evitan el embarazo. Deben tomarse una vez al da, preferentemente a la misma hora cada da. Se necesita una receta para utilizar este mtodo anticonceptivo. Parche anticonceptivo El parche anticonceptivo contiene hormonas que evitan el embarazo. Se coloca en la piel, debe cambiarse una vez a la semana durante tres semanas y debe retirarse en la cuarta semana. Se necesita una receta para utilizar este mtodo anticonceptivo. Anillo vaginal Un anillo vaginal contiene hormonas que evitan el embarazo. Se coloca en la vagina durante tres semanas y se retira en la cuarta semana. Luego se repite el proceso con un anillo nuevo. Se necesita una receta para utilizar este mtodo anticonceptivo. Anticonceptivo de emergencia Los anticonceptivos de emergencia son mtodos para evitar un embarazo despus de tener sexo sin proteccin. Vienen en forma de pldora y pueden tomarse hasta 5 das despus de tener sexo. Funcionan mejor cuando se toman lo ms pronto posible luego de tener sexo. La mayora de los anticonceptivos de emergencia estn disponibles sin receta mdica. Este mtodo no debe utilizarse como el nico mtodo anticonceptivo. Mtodos de  barrera Condn masculino Un condn masculino es una vaina delgada que se coloca sobre el pene durante el sexo. Los condones evitan que el esperma ingrese en el cuerpo de la mujer. Pueden utilizarse con un una sustancia que mata a los espermatozoides (espermicida) para aumentar la efectividad. Deben desecharse despus de un uso. Condn femenino Un condn femenino es una vaina blanda y holgada que se coloca en la vagina antes de tener sexo. El condn evita que el esperma ingrese en el cuerpo de la mujer. Deben desecharse despus de un uso. Diafragma Un diafragma es una barrera blanda con forma de cpula. Se inserta en la vagina antes del sexo, junto con un espermicida. El diafragma bloquea el ingreso de esperma en el tero, y el espermicida mata a los espermatozoides. El diafragma debe permanecer en la vagina durante 6 a 8 horas despus de tener sexo y debe retirarse en el plazo de las 24 horas. Un diafragma es recetado y colocado por un mdico. Debe reemplazarse cada 1 a 2 aos, despus de dar a luz, de aumentar ms de 15lb (6.8kg) y de una ciruga plvica. Capuchn cervical Un capuchn cervical es una copa redonda y blanda de ltex o plstico que se coloca en el cuello uterino. Se inserta en la vagina antes del sexo, junto con un espermicida. Bloquea el ingreso del esperma en el tero. El capuchn debe permanecer en el lugar durante 6 a 8 horas despus de tener sexo y debe retirarse en el plazo de las 48 horas. Un capuchn cervical debe ser recetado y colocado por un mdico. Debe reemplazarse cada 2aos. Esponja Una esponja es una pieza blanda y circular de espuma de poliuretano que contiene espermicida. La esponja ayuda a bloquear el ingreso de esperma en el tero, y el espermicida mata a   los espermatozoides. Para utilizarla, debe humedecerla e insertarla en la vagina. Debe insertarse antes de tener sexo, debe permanecer dentro al menos durante 6 horas despus de tener sexo y debe retirarse y  desecharse en el plazo de las 30 horas. Espermicidas Los espermicidas son sustancias qumicas que matan o bloquean al esperma y no lo dejan ingresar al cuello uterino y al tero. Vienen en forma de crema, gel, supositorio, espuma o comprimido. Un espermicida debe insertarse en la vagina con un aplicador al menos 10 o 15 minutos antes de tener sexo para dar tiempo a que surta efecto. El proceso debe repetirse cada vez que tenga sexo. Los espermicidas no requieren receta mdica. Anticonceptivos intrauterinos Dispositivo intrauterino (DIU) Un DIU es un dispositivo en forma de T que se coloca en el tero. Existen dos tipos: DIU hormonal.Este tipo contiene progestina, una forma sinttica de la hormona progesterona. Este tipo puede permanecer colocado durante 3 a 5 aos. DIU de cobre.Este tipo est recubierto con un alambre de cobre. Puede permanecer colocado durante 10 aos. Mtodos anticonceptivos permanentes Ligadura de trompas en la mujer En este mtodo, se sellan, atan u obstruyen las trompas de Falopio durante una ciruga para evitar que el vulo descienda hacia el tero. Esterilizacin histeroscpica En este mtodo, se coloca un implante pequeo y flexible dentro de cada trompa de Falopio. Los implantes hacen que se forme un tejido cicatricial en las trompas de Falopio y que las obstruya para que el espermatozoide no pueda llegar al vulo. El procedimiento demora alrededor de 3 meses para que sea efectivo. Debe utilizarse otro mtodo anticonceptivo durante esos 3 meses. Esterilizacin masculina Este es un procedimiento que consiste en atar los conductos que transportan el esperma (vasectoma). Luego del procedimiento, el hombre puede eyacular lquido (semen). Debe utilizarse otro mtodo anticonceptivo durante 3 meses despus del procedimiento. Mtodos de planificacin natural Planificacin familiar natural En este mtodo, la pareja no tiene sexo durante los das en que la mujer podra quedar  embarazada. Mtodo calendario En este mtodo, la mujer realiza un seguimiento de la duracin de cada ciclo menstrual, identifica los das en los que se puede producir un embarazo y no tiene sexo durante esos das. Mtodo de la ovulacin En este mtodo, la pareja evita tener sexo durante la ovulacin. Mtodo sintotrmico Este mtodo implica no tener sexo durante la ovulacin. Normalmente, la mujer comprueba la ovulacin al observar cambios en su temperatura y en la consistencia del moco cervical. Mtodo posovulacin En este mtodo, la pareja espera a que finalice la ovulacin para tener sexo. Dnde buscar ms informacin Centers for Disease Control and Prevention (Centros para el Control y la Prevencin de Enfermedades): www.cdc.gov Resumen La anticoncepcin, o los mtodos anticonceptivos, hace referencia a los mtodos o dispositivos que evitan el embarazo. Los mtodos anticonceptivos hormonales incluyen implantes, inyecciones, pastillas, parches, anillos vaginales y anticonceptivos de emergencia. Los mtodos anticonceptivos de barrera pueden incluir condones masculinos, condones femeninos, diafragmas, capuchones cervicales, esponjas y espermicidas. Existen dos tipos de DIU (dispositivo intrauterino). Un DIU puede colocarse en el tero de una mujer para evitar el embarazo durante 3 a 5 aos. La esterilizacin permanente puede realizarse mediante un procedimiento tanto en los hombres como en las mujeres. Los mtodos de planificacin familiar natural implican no tener sexo durante los das en que la mujer podra quedar embarazada. Esta informacin no tiene como fin reemplazar el consejo del mdico. Asegrese de hacerle al mdico cualquier pregunta que tenga. Document Revised: 02/29/2020 Document Reviewed: 02/29/2020 Elsevier Patient Education  2022 Elsevier   Inc.  

## 2021-11-21 NOTE — Progress Notes (Signed)
?  ? ?Subjective:  ? ?Doris Hendricks is a 36 y.o. G3P2002 at [redacted]w[redacted]d by LMP being seen today for her first obstetrical visit.  Her obstetrical history is significant for  early GDM . Patient does intend to breast feed. Pregnancy history fully reviewed. ? ?Patient reports no complaints. ? ?HISTORY: ?OB History  ?Gravida Para Term Preterm AB Living  ?3 2 2  0 0 2  ?SAB IAB Ectopic Multiple Live Births  ?0 0 0 0 2  ?  ?# Outcome Date GA Lbr Len/2nd Weight Sex Delivery Anes PTL Lv  ?3 Current           ?2 Term 11/27/14 [redacted]w[redacted]d 03:59 / 01:25 6 lb 11.9 oz (3.06 kg) F Vag-Spont EPI  LIV  ?   Apgar1: 8  Apgar5: 9  ?1 Term 01/17/11 [redacted]w[redacted]d  7 lb (3.175 kg) M Vag-Spont   LIV  ?   Complications: Gestational diabetes  ?  ? ?Last pap smear: ?No results found for: DIAGPAP, HPV, HPVHIGH ?NILM 10/25/2021 ? ?Past Medical History:  ?Diagnosis Date  ? Medical history non-contributory   ? ?Past Surgical History:  ?Procedure Laterality Date  ? NO PAST SURGERIES    ? ?Family History  ?Problem Relation Age of Onset  ? Diabetes Mother   ? Hypertension Mother   ? ?Social History  ? ?Tobacco Use  ? Smoking status: Never  ? Smokeless tobacco: Never  ?Substance Use Topics  ? Alcohol use: No  ? Drug use: No  ? ?No Known Allergies ?Current Outpatient Medications on File Prior to Visit  ?Medication Sig Dispense Refill  ? prenatal vitamin w/FE, FA (PRENATAL 1 + 1) 27-1 MG TABS tablet Take 1 tablet by mouth daily at 12 noon.    ? ?No current facility-administered medications on file prior to visit.  ? ? ? ?Exam  ? ?Vitals:  ? 11/21/21 1012  ?BP: 107/77  ?Pulse: 83  ?Weight: 121 lb (54.9 kg)  ? ?Fetal Heart Rate (bpm): 140 ? ?System: General: well-developed, well-nourished female in no acute distress  ? Skin: normal coloration and turgor, no rashes  ? Neurologic: oriented, normal, negative, normal mood  ? Extremities: normal strength, tone, and muscle mass, ROM of all joints is normal  ? HEENT PERRLA, extraocular movement intact and sclera clear,  anicteric  ? Neck supple and no masses  ? Respiratory:  no respiratory distress  ? ? ?  ?Assessment:  ? ?Pregnancy: 01/21/22 ?Patient Active Problem List  ? Diagnosis Date Noted  ? Supervision of high risk pregnancy, antepartum 11/21/2021  ? Gestational diabetes mellitus 11/21/2021  ? Advanced maternal age in multigravida 11/21/2021  ? Abnormal glucose in pregnancy, antepartum 11/15/2021  ? History of gestational diabetes mellitus in last pregnancy 10/10/2014  ? ?  ?Plan:  ?1. Supervision of high risk pregnancy, antepartum ?BP and FHR normal ?Initial labs drawn. ?Continue prenatal vitamins. ?Genetic Screening discussed, NIPS: results reviewed. ?Ultrasound discussed; fetal anatomic survey: ordered. ?Problem list reviewed and updated. ?The nature of [facility] with multiple MDs and other Advanced Practice Providers was explained to patient; also emphasized that residents, students are part of our team. ?- [facility] MFM OB DETAIL +14 WK; Future ? ?2. Diet controlled gestational diabetes mellitus (GDM) in second trimester ?Log reviewed, 65% at goal, discussed she is borderline to start meds, will reassess at next visit but likely will need metformin ?Fairly early diagnosis and no A1c on file, will collect today to see if she needs fetal  echo ?Start prenatal ASA ? ?3. Multigravida of advanced maternal age in second trimester ? ? ? ?Routine obstetric precautions reviewed. ?Return in 4 weeks (on 12/19/2021) for [facility], ob visit. ? ?  ? ?

## 2021-11-23 LAB — AFP, SERUM, OPEN SPINA BIFIDA
AFP MoM: 0.85
AFP Value: 55 ng/mL
Gest. Age on Collection Date: 19.7 weeks
Maternal Age At EDD: 37.7 yr
OSBR Risk 1 IN: 10000
Test Results:: NEGATIVE
Weight: 121 [lb_av]

## 2021-11-23 LAB — HEMOGLOBIN A1C
Est. average glucose Bld gHb Est-mCnc: 100 mg/dL
Hgb A1c MFr Bld: 5.1 % (ref 4.8–5.6)

## 2021-11-26 ENCOUNTER — Encounter: Payer: Self-pay | Admitting: *Deleted

## 2021-11-27 DIAGNOSIS — O24410 Gestational diabetes mellitus in pregnancy, diet controlled: Secondary | ICD-10-CM

## 2021-11-27 DIAGNOSIS — O0990 Supervision of high risk pregnancy, unspecified, unspecified trimester: Secondary | ICD-10-CM

## 2021-11-27 DIAGNOSIS — O09522 Supervision of elderly multigravida, second trimester: Secondary | ICD-10-CM

## 2021-11-27 LAB — GLUCOSE TOLERANCE, 2 HOURS
Glucose 1 Hour: 191
Glucose 3 Hour: 142
Glucose, 2 hour: 162
Glucose, Fasting: 93

## 2021-11-30 ENCOUNTER — Ambulatory Visit: Payer: Self-pay | Attending: Obstetrics | Admitting: Obstetrics

## 2021-11-30 ENCOUNTER — Ambulatory Visit: Payer: Self-pay | Admitting: *Deleted

## 2021-11-30 ENCOUNTER — Ambulatory Visit: Payer: Self-pay | Attending: Family Medicine

## 2021-11-30 VITALS — BP 111/65 | HR 87

## 2021-11-30 DIAGNOSIS — O0990 Supervision of high risk pregnancy, unspecified, unspecified trimester: Secondary | ICD-10-CM | POA: Insufficient documentation

## 2021-11-30 DIAGNOSIS — O09522 Supervision of elderly multigravida, second trimester: Secondary | ICD-10-CM | POA: Insufficient documentation

## 2021-11-30 DIAGNOSIS — O24410 Gestational diabetes mellitus in pregnancy, diet controlled: Secondary | ICD-10-CM | POA: Insufficient documentation

## 2021-11-30 DIAGNOSIS — Z3A21 21 weeks gestation of pregnancy: Secondary | ICD-10-CM

## 2021-11-30 NOTE — Progress Notes (Signed)
MFM Note ? ?Doris Hendricks was seen for a detailed fetal anatomy scan due to advanced maternal age.  She was recently diagnosed with diet-controlled gestational diabetes.  She was screened early for gestational diabetes as she had gestational diabetes during her last pregnancy. ? ?She had a cell free DNA test earlier in her pregnancy which indicated a low risk for trisomy 61, 52, and 13. A female fetus is predicted.  ? ?She was informed that the fetal growth and amniotic fluid level were appropriate for her gestational age.  ? ?On today's exam, an intracardiac echogenic focus was noted in the left ventricle of the fetal heart.  The small association between an echogenic focus and Down syndrome was discussed.  ? ?Due to the echogenic focus noted today, the patient was offered and declined an amniocentesis today for definitive diagnosis of fetal aneuploidy.  She reports that she is comfortable with her negative cell free DNA test.   ? ?Her last child was also found to have an echogenic focus during the fetal anatomy scan.  That child is doing well today.  Therefore, she is less concerned regarding this finding. ? ?The patient was informed that anomalies may be missed due to technical limitations. If the fetus is in a suboptimal position or maternal habitus is increased, visualization of the fetus in the maternal uterus may be impaired. ? ?The following were discussed today: ? ?Diet-controlled gestational diabetes ? ?The implications and management of diabetes in pregnancy was discussed in detail with the patient.   ? ?She was advised to continue to monitor her fingersticks 4 times daily (fasting and 2 hours after each meal).   ? ?She was advised that our goals for her fingerstick values are fasting values of 90-95 or less and two-hour postprandial values of 120 or less.   ? ?Should the majority of her fingerstick results be above these values, she may have to be started on insulin or metformin to help her  achieve better glycemic control.  ? ?The patient was advised that getting her fingerstick values as close to these goals as possible would provide her with the most optimal obstetrical outcome. ? ?The increased risk of polyhydramnios, fetal macrosomia, and preeclampsia associated with diabetes was also discussed.   ? ?Delivery for well-controlled diabetes in pregnancy is usually recommended at around 39 weeks.  Delivery at 37 weeks may be considered should her glycemic control be poor. ? ?Due to advanced maternal age and gestational diabetes, we will continue to follow her with monthly growth ultrasounds. ? ?The patient stated that all of her questions have been answered. ? ?A follow-up exam was scheduled in 4 weeks.   ? ?All conversations were held with the patient today with the help of a Spanish interpreter. ? ?A total of 30 minutes was spent counseling and coordinating the care for this patient.  Greater than 50% of the time was spent in direct face-to-face contact.  ?

## 2021-12-01 ENCOUNTER — Encounter: Payer: Self-pay | Admitting: Family Medicine

## 2021-12-01 DIAGNOSIS — O283 Abnormal ultrasonic finding on antenatal screening of mother: Secondary | ICD-10-CM | POA: Insufficient documentation

## 2021-12-03 ENCOUNTER — Other Ambulatory Visit: Payer: Self-pay | Admitting: *Deleted

## 2021-12-03 DIAGNOSIS — O24410 Gestational diabetes mellitus in pregnancy, diet controlled: Secondary | ICD-10-CM

## 2021-12-03 DIAGNOSIS — O09522 Supervision of elderly multigravida, second trimester: Secondary | ICD-10-CM

## 2021-12-19 ENCOUNTER — Ambulatory Visit (INDEPENDENT_AMBULATORY_CARE_PROVIDER_SITE_OTHER): Payer: Self-pay | Admitting: Family Medicine

## 2021-12-19 ENCOUNTER — Encounter: Payer: Self-pay | Admitting: Family Medicine

## 2021-12-19 VITALS — BP 113/79 | HR 93 | Wt 124.7 lb

## 2021-12-19 DIAGNOSIS — O24410 Gestational diabetes mellitus in pregnancy, diet controlled: Secondary | ICD-10-CM

## 2021-12-19 DIAGNOSIS — O0990 Supervision of high risk pregnancy, unspecified, unspecified trimester: Secondary | ICD-10-CM

## 2021-12-19 DIAGNOSIS — O09522 Supervision of elderly multigravida, second trimester: Secondary | ICD-10-CM

## 2021-12-19 DIAGNOSIS — K219 Gastro-esophageal reflux disease without esophagitis: Secondary | ICD-10-CM

## 2021-12-19 DIAGNOSIS — O283 Abnormal ultrasonic finding on antenatal screening of mother: Secondary | ICD-10-CM

## 2021-12-19 DIAGNOSIS — O99619 Diseases of the digestive system complicating pregnancy, unspecified trimester: Secondary | ICD-10-CM

## 2021-12-19 MED ORDER — FAMOTIDINE 20 MG PO TABS: 20.0000 mg | ORAL_TABLET | Freq: Two times a day (BID) | ORAL | 2 refills | Status: DC

## 2021-12-19 NOTE — Patient Instructions (Signed)
Eleccin del mtodo anticonceptivo Contraception Choices La anticoncepcin, o los mtodos anticonceptivos, hace referencia a los mtodos o dispositivos que evitan el embarazo. Mtodos hormonales  Implante anticonceptivo Un implante anticonceptivo consiste en un tubo delgado de plstico que contiene una hormona que evita el embarazo. Es diferente de un dispositivo intrauterino (DIU). Un mdico lo inserta en la parte superior del brazo. Los implantes pueden ser eficaces durante un mximo de 3 aos. Inyecciones de progestina sola Las inyecciones de progestina sola contienen progestina, una forma sinttica de la hormona progesterona. Un mdico las administra cada 3 meses. Pldoras anticonceptivas Las pldoras anticonceptivas son pastillas que contienen hormonas que evitan el embarazo. Deben tomarse una vez al da, preferentemente a la misma hora cada da. Se necesita una receta para utilizar este mtodo anticonceptivo. Parche anticonceptivo El parche anticonceptivo contiene hormonas que evitan el embarazo. Se coloca en la piel, debe cambiarse una vez a la semana durante tres semanas y debe retirarse en la cuarta semana. Se necesita una receta para utilizar este mtodo anticonceptivo. Anillo vaginal Un anillo vaginal contiene hormonas que evitan el embarazo. Se coloca en la vagina durante tres semanas y se retira en la cuarta semana. Luego se repite el proceso con un anillo nuevo. Se necesita una receta para utilizar este mtodo anticonceptivo. Anticonceptivo de emergencia Los anticonceptivos de emergencia son mtodos para evitar un embarazo despus de tener sexo sin proteccin. Vienen en forma de pldora y pueden tomarse hasta 5 das despus de tener sexo. Funcionan mejor cuando se toman lo ms pronto posible luego de tener sexo. La mayora de los anticonceptivos de emergencia estn disponibles sin receta mdica. Este mtodo no debe utilizarse como el nico mtodo anticonceptivo. Mtodos de  barrera  Condn masculino Un condn masculino es una vaina delgada que se coloca sobre el pene durante el sexo. Los condones evitan que el esperma ingrese en el cuerpo de la mujer. Pueden utilizarse con un una sustancia que mata a los espermatozoides (espermicida) para aumentar la efectividad. Deben desecharse despus de un uso. Condn femenino Un condn femenino es una vaina blanda y holgada que se coloca en la vagina antes de tener sexo. El condn evita que el esperma ingrese en el cuerpo de la mujer. Deben desecharse despus de un uso. Diafragma Un diafragma es una barrera blanda con forma de cpula. Se inserta en la vagina antes del sexo, junto con un espermicida. El diafragma bloquea el ingreso de esperma en el tero, y el espermicida mata a los espermatozoides. El diafragma debe permanecer en la vagina durante 6 a 8 horas despus de tener sexo y debe retirarse en el plazo de las 24 horas. Un diafragma es recetado y colocado por un mdico. Debe reemplazarse cada 1 a 2 aos, despus de dar a luz, de aumentar ms de 15lb (6.8kg) y de una ciruga plvica. Capuchn cervical Un capuchn cervical es una copa redonda y blanda de ltex o plstico que se coloca en el cuello uterino. Se inserta en la vagina antes del sexo, junto con un espermicida. Bloquea el ingreso del esperma en el tero. El capuchn debe permanecer en el lugar durante 6 a 8 horas despus de tener sexo y debe retirarse en el plazo de las 48 horas. Un capuchn cervical debe ser recetado y colocado por un mdico. Debe reemplazarse cada 2aos. Esponja Una esponja es una pieza blanda y circular de espuma de poliuretano que contiene espermicida. La esponja ayuda a bloquear el ingreso de esperma en el tero, y el espermicida   mata a los espermatozoides. Para utilizarla, debe humedecerla e insertarla en la vagina. Debe insertarse antes de tener sexo, debe permanecer dentro al menos durante 6 horas despus de tener sexo y debe retirarse y  desecharse en el plazo de las 30 horas. Espermicidas Los espermicidas son sustancias qumicas que matan o bloquean al esperma y no lo dejan ingresar al cuello uterino y al tero. Vienen en forma de crema, gel, supositorio, espuma o comprimido. Un espermicida debe insertarse en la vagina con un aplicador al menos 10 o 15 minutos antes de tener sexo para dar tiempo a que surta efecto. El proceso debe repetirse cada vez que tenga sexo. Los espermicidas no requieren receta mdica. Anticonceptivos intrauterinos Dispositivo intrauterino (DIU) Un DIU es un dispositivo en forma de T que se coloca en el tero. Existen dos tipos: DIU hormonal.Este tipo contiene progestina, una forma sinttica de la hormona progesterona. Este tipo puede permanecer colocado durante 3 a 5 aos. DIU de cobre.Este tipo est recubierto con un alambre de cobre. Puede permanecer colocado durante 10 aos. Mtodos anticonceptivos permanentes Ligadura de trompas en la mujer En este mtodo, se sellan, atan u obstruyen las trompas de Falopio durante una ciruga para evitar que el vulo descienda hacia el tero. Esterilizacin histeroscpica En este mtodo, se coloca un implante pequeo y flexible dentro de cada trompa de Falopio. Los implantes hacen que se forme un tejido cicatricial en las trompas de Falopio y que las obstruya para que el espermatozoide no pueda llegar al vulo. El procedimiento demora alrededor de 3 meses para que sea efectivo. Debe utilizarse otro mtodo anticonceptivo durante esos 3 meses. Esterilizacin masculina Este es un procedimiento que consiste en atar los conductos que transportan el esperma (vasectoma). Luego del procedimiento, el hombre puede eyacular lquido (semen). Debe utilizarse otro mtodo anticonceptivo durante 3 meses despus del procedimiento. Mtodos de planificacin natural Planificacin familiar natural En este mtodo, la pareja no tiene sexo durante los das en que la mujer podra quedar  embarazada. Mtodo calendario En este mtodo, la mujer realiza un seguimiento de la duracin de cada ciclo menstrual, identifica los das en los que se puede producir un embarazo y no tiene sexo durante esos das. Mtodo de la ovulacin En este mtodo, la pareja evita tener sexo durante la ovulacin. Mtodo sintotrmico Este mtodo implica no tener sexo durante la ovulacin. Normalmente, la mujer comprueba la ovulacin al observar cambios en su temperatura y en la consistencia del moco cervical. Mtodo posovulacin En este mtodo, la pareja espera a que finalice la ovulacin para tener sexo. Dnde buscar ms informacin Centers for Disease Control and Prevention (Centros para el Control y la Prevencin de Enfermedades): www.cdc.gov Resumen La anticoncepcin, o los mtodos anticonceptivos, hace referencia a los mtodos o dispositivos que evitan el embarazo. Los mtodos anticonceptivos hormonales incluyen implantes, inyecciones, pastillas, parches, anillos vaginales y anticonceptivos de emergencia. Los mtodos anticonceptivos de barrera pueden incluir condones masculinos, condones femeninos, diafragmas, capuchones cervicales, esponjas y espermicidas. Existen dos tipos de DIU (dispositivo intrauterino). Un DIU puede colocarse en el tero de una mujer para evitar el embarazo durante 3 a 5 aos. La esterilizacin permanente puede realizarse mediante un procedimiento tanto en los hombres como en las mujeres. Los mtodos de planificacin familiar natural implican no tener sexo durante los das en que la mujer podra quedar embarazada. Esta informacin no tiene como fin reemplazar el consejo del mdico. Asegrese de hacerle al mdico cualquier pregunta que tenga. Document Revised: 02/29/2020 Document Reviewed: 02/29/2020 Elsevier Patient Education    2023 [employer] Inc.  

## 2021-12-19 NOTE — Progress Notes (Signed)
? ?  Subjective:  ?Doris Hendricks is a 36 y.o. G3P2002 at [redacted]w[redacted]d being seen today for ongoing prenatal care.  She is currently monitored for the following issues for this high-risk pregnancy and has History of gestational diabetes mellitus in last pregnancy; Abnormal glucose in pregnancy, antepartum; Supervision of high risk pregnancy, antepartum; Gestational diabetes mellitus; Advanced maternal age in multigravida; and Echogenic intracardiac focus of fetus on prenatal ultrasound on their problem list. ? ?Patient reports no complaints.  Contractions: Not present. Vag. Bleeding: None.  Movement: Present. Denies leaking of fluid.  ? ?The following portions of the patient's history were reviewed and updated as appropriate: allergies, current medications, past family history, past medical history, past social history, past surgical history and problem list. Problem list updated. ? ?Objective:  ? ?Vitals:  ? 12/19/21 1626  ?BP: 113/79  ?Pulse: 93  ?Weight: 124 lb 11.2 oz (56.6 kg)  ? ? ?Fetal Status: Fetal Heart Rate (bpm): 148   Movement: Present    ? ?General:  Alert, oriented and cooperative. Patient is in no acute distress.  ?Skin: Skin is warm and dry. No rash noted.   ?Cardiovascular: Normal heart rate noted  ?Respiratory: Normal respiratory effort, no problems with respiration noted  ?Abdomen: Soft, gravid, appropriate for gestational age. Pain/Pressure: Absent     ?Pelvic: Vag. Bleeding: None     ?Cervical exam deferred        ?Extremities: Normal range of motion.     ?Mental Status: Normal mood and affect. Normal behavior. Normal judgment and thought content.  ? ?Urinalysis:     ? ? ? ? ?Assessment and Plan:  ?Pregnancy: [id number] at [redacted]w[redacted]d ? ?1. Supervision of high risk pregnancy, antepartum ?BP and FHR normal ?Trial pepcid and tums for gerd ? ?2. Diet controlled gestational diabetes mellitus (GDM) in second trimester ?Sugars 73% at goal with mostly excursions around lunch, emphasized dietary adherence ?Defer  meds at this time but likely coming soon ?On asa ?Following w MFM ? ?3. Echogenic intracardiac focus of fetus on prenatal ultrasound ?isolated ? ?4. Multigravida of advanced maternal age in second trimester ? ? ?Preterm labor symptoms and general obstetric precautions including but not limited to vaginal bleeding, contractions, leaking of fluid and fetal movement were reviewed in detail with the patient. ?Please refer to After Visit Summary for other counseling recommendations.  ?Return in 4 weeks (on 01/16/2022) for [facility], ob visit, needs MD. ? ? ?Venora Maples, MD ? ?

## 2021-12-28 ENCOUNTER — Ambulatory Visit: Payer: Self-pay | Admitting: *Deleted

## 2021-12-28 ENCOUNTER — Ambulatory Visit: Payer: Medicaid Other | Attending: Obstetrics

## 2021-12-28 VITALS — BP 105/60 | HR 85

## 2021-12-28 DIAGNOSIS — Z3A25 25 weeks gestation of pregnancy: Secondary | ICD-10-CM

## 2021-12-28 DIAGNOSIS — O24410 Gestational diabetes mellitus in pregnancy, diet controlled: Secondary | ICD-10-CM | POA: Insufficient documentation

## 2021-12-28 DIAGNOSIS — Z148 Genetic carrier of other disease: Secondary | ICD-10-CM

## 2021-12-28 DIAGNOSIS — O09522 Supervision of elderly multigravida, second trimester: Secondary | ICD-10-CM | POA: Insufficient documentation

## 2021-12-28 DIAGNOSIS — O0990 Supervision of high risk pregnancy, unspecified, unspecified trimester: Secondary | ICD-10-CM

## 2021-12-31 ENCOUNTER — Other Ambulatory Visit: Payer: Self-pay | Admitting: *Deleted

## 2021-12-31 DIAGNOSIS — O09522 Supervision of elderly multigravida, second trimester: Secondary | ICD-10-CM

## 2021-12-31 DIAGNOSIS — O283 Abnormal ultrasonic finding on antenatal screening of mother: Secondary | ICD-10-CM

## 2021-12-31 DIAGNOSIS — O24410 Gestational diabetes mellitus in pregnancy, diet controlled: Secondary | ICD-10-CM

## 2022-01-16 ENCOUNTER — Encounter: Payer: Self-pay | Admitting: Family Medicine

## 2022-01-16 ENCOUNTER — Ambulatory Visit (INDEPENDENT_AMBULATORY_CARE_PROVIDER_SITE_OTHER): Payer: Self-pay | Admitting: Family Medicine

## 2022-01-16 VITALS — BP 115/77 | HR 90 | Wt 123.9 lb

## 2022-01-16 DIAGNOSIS — O24415 Gestational diabetes mellitus in pregnancy, controlled by oral hypoglycemic drugs: Secondary | ICD-10-CM

## 2022-01-16 DIAGNOSIS — O09522 Supervision of elderly multigravida, second trimester: Secondary | ICD-10-CM

## 2022-01-16 DIAGNOSIS — O0990 Supervision of high risk pregnancy, unspecified, unspecified trimester: Secondary | ICD-10-CM

## 2022-01-16 DIAGNOSIS — O283 Abnormal ultrasonic finding on antenatal screening of mother: Secondary | ICD-10-CM

## 2022-01-16 MED ORDER — METFORMIN HCL 1000 MG PO TABS: 1000.0000 mg | ORAL_TABLET | Freq: Two times a day (BID) | ORAL | 5 refills | Status: DC

## 2022-01-16 NOTE — Patient Instructions (Signed)

## 2022-01-16 NOTE — Progress Notes (Signed)
   Subjective:  Shayma Schwiesow is a 36 y.o. G3P2002 at [redacted]w[redacted]d being seen today for ongoing prenatal care.  She is currently monitored for the following issues for this high-risk pregnancy and has History of gestational diabetes mellitus in last pregnancy; Abnormal glucose in pregnancy, antepartum; Supervision of high risk pregnancy, antepartum; Gestational diabetes mellitus; Advanced maternal age in multigravida; and Echogenic intracardiac focus of fetus on prenatal ultrasound on their problem list.  Patient reports no complaints.  Contractions: Irritability. Vag. Bleeding: None.  Movement: Present. Denies leaking of fluid.   The following portions of the patient's history were reviewed and updated as appropriate: allergies, current medications, past family history, past medical history, past social history, past surgical history and problem list. Problem list updated.  Objective:   Vitals:   01/16/22 1337  BP: 115/77  Pulse: 90  Weight: 123 lb 14.4 oz (56.2 kg)    Fetal Status: Fetal Heart Rate (bpm): 145   Movement: Present     General:  Alert, oriented and cooperative. Patient is in no acute distress.  Skin: Skin is warm and dry. No rash noted.   Cardiovascular: Normal heart rate noted  Respiratory: Normal respiratory effort, no problems with respiration noted  Abdomen: Soft, gravid, appropriate for gestational age. Pain/Pressure: Absent     Pelvic: Vag. Bleeding: None     Cervical exam deferred        Extremities: Normal range of motion.     Mental Status: Normal mood and affect. Normal behavior. Normal judgment and thought content.   Urinalysis:        Assessment and Plan:  Pregnancy: G3P2002 at [redacted]w[redacted]d  1. Supervision of high risk pregnancy, antepartum BP and FHR normal  2. Gestational diabetes mellitus (GDM) in third trimester controlled on oral hypoglycemic drug Unfortunately overall poor control, see log above Will start metformin 1g BID, suspect she will need  insulin down the line Last growth [place] 12/28/2021 EFW 35%, normal AFI Has follow up growth on 01/24/22 For antenatal testing at 32 weeks On ASA  3. Multigravida of advanced maternal age in second trimester   4. Echogenic intracardiac focus of fetus on prenatal ultrasound   Preterm labor symptoms and general obstetric precautions including but not limited to vaginal bleeding, contractions, leaking of fluid and fetal movement were reviewed in detail with the patient. Please refer to After Visit Summary for other counseling recommendations.  Return in 2 weeks (on 01/30/2022) for [facility], ob visit.   Clarnce Flock, MD

## 2022-01-24 ENCOUNTER — Ambulatory Visit: Payer: Self-pay | Admitting: *Deleted

## 2022-01-24 ENCOUNTER — Ambulatory Visit: Payer: Medicaid Other | Attending: Obstetrics and Gynecology

## 2022-01-24 ENCOUNTER — Other Ambulatory Visit: Payer: Self-pay | Admitting: *Deleted

## 2022-01-24 VITALS — BP 110/69 | HR 85

## 2022-01-24 DIAGNOSIS — O285 Abnormal chromosomal and genetic finding on antenatal screening of mother: Secondary | ICD-10-CM | POA: Diagnosis not present

## 2022-01-24 DIAGNOSIS — Z148 Genetic carrier of other disease: Secondary | ICD-10-CM | POA: Diagnosis not present

## 2022-01-24 DIAGNOSIS — O24415 Gestational diabetes mellitus in pregnancy, controlled by oral hypoglycemic drugs: Secondary | ICD-10-CM | POA: Diagnosis not present

## 2022-01-24 DIAGNOSIS — O0990 Supervision of high risk pregnancy, unspecified, unspecified trimester: Secondary | ICD-10-CM | POA: Insufficient documentation

## 2022-01-24 DIAGNOSIS — O283 Abnormal ultrasonic finding on antenatal screening of mother: Secondary | ICD-10-CM | POA: Insufficient documentation

## 2022-01-24 DIAGNOSIS — O24410 Gestational diabetes mellitus in pregnancy, diet controlled: Secondary | ICD-10-CM | POA: Insufficient documentation

## 2022-01-24 DIAGNOSIS — O09522 Supervision of elderly multigravida, second trimester: Secondary | ICD-10-CM | POA: Insufficient documentation

## 2022-01-24 DIAGNOSIS — Z3A28 28 weeks gestation of pregnancy: Secondary | ICD-10-CM

## 2022-01-24 DIAGNOSIS — O09523 Supervision of elderly multigravida, third trimester: Secondary | ICD-10-CM

## 2022-02-07 ENCOUNTER — Ambulatory Visit (INDEPENDENT_AMBULATORY_CARE_PROVIDER_SITE_OTHER): Payer: Self-pay | Admitting: Family Medicine

## 2022-02-07 ENCOUNTER — Encounter: Payer: Self-pay | Admitting: General Practice

## 2022-02-07 VITALS — BP 107/72 | HR 101 | Wt 125.0 lb

## 2022-02-07 DIAGNOSIS — O09523 Supervision of elderly multigravida, third trimester: Secondary | ICD-10-CM

## 2022-02-07 DIAGNOSIS — O24415 Gestational diabetes mellitus in pregnancy, controlled by oral hypoglycemic drugs: Secondary | ICD-10-CM

## 2022-02-07 DIAGNOSIS — O0990 Supervision of high risk pregnancy, unspecified, unspecified trimester: Secondary | ICD-10-CM

## 2022-02-07 DIAGNOSIS — Z8632 Personal history of gestational diabetes: Secondary | ICD-10-CM

## 2022-02-07 NOTE — Progress Notes (Signed)
   PRENATAL VISIT NOTE  Subjective:  Doris Hendricks is a 36 y.o. G3P2002 at [redacted]w[redacted]d being seen today for ongoing prenatal care.  She is currently monitored for the following issues for this high-risk pregnancy and has History of gestational diabetes mellitus in last pregnancy; Abnormal glucose in pregnancy, antepartum; Supervision of high risk pregnancy, antepartum; Gestational diabetes mellitus; Advanced maternal age in multigravida; and Echogenic intracardiac focus of fetus on prenatal ultrasound on their problem list.  Patient reports  leg pain- bilateral  .  Contractions: Not present. Vag. Bleeding: None.   . Denies leaking of fluid.   The following portions of the patient's history were reviewed and updated as appropriate: allergies, current medications, past family history, past medical history, past social history, past surgical history and problem list.   Objective:   Vitals:   02/07/22 1356  BP: 107/72  Pulse: (!) 101  Weight: 125 lb (56.7 kg)    Fetal Status: Fetal Heart Rate (bpm): 150 Fundal Height: 31 cm       General:  Alert, oriented and cooperative. Patient is in no acute distress.  Skin: Skin is warm and dry. No rash noted.   Cardiovascular: Normal heart rate noted  Respiratory: Normal respiratory effort, no problems with respiration noted  Abdomen: Soft, gravid, appropriate for gestational age.  Pain/Pressure: Absent     Pelvic: Cervical exam deferred        Extremities: Normal range of motion.  Edema: None  Mental Status: Normal mood and affect. Normal behavior. Normal judgment and thought content.   Assessment and Plan:  Pregnancy: G3P2002 at [redacted]w[redacted]d  1. Supervision of high risk pregnancy, antepartum Leg cramping, mostly at night. Recommended magnesium supplement and increasing dietary potassium. Not concerned about DVT as legs are soft, compressible and not tender today  2. Gestational diabetes mellitus (GDM) in third trimester controlled on oral  hypoglycemic drug On Metformin Fastings are all under 95 and PP are under 120. Doing well with diet and taking medication. Noted some GI upset when she started medication. Counseled about normal SE and likely will resolve.  - Starting Antenatal testing at 32 weeks - EFW at 36 weeks - IOL at 12  3. Multigravida of advanced maternal age in third trimester NIPS LR  4. History of gestational diabetes mellitus in last pregnancy   Preterm labor symptoms and general obstetric precautions including but not limited to vaginal bleeding, contractions, leaking of fluid and fetal movement were reviewed in detail with the patient. Please refer to After Visit Summary for other counseling recommendations.   Return in about 11 days (around 02/18/2022) for Prenatal visit, with Weekly BPPs starting after 7/7.  Future Appointments  Date Time Provider Department Center  02/15/2022  3:30 PM [facility] NURSE [facility]  02/15/2022  3:45 PM [facility]  02/21/2022  3:55 PM Adam Phenix, MD [facility]  02/22/2022  2:15 PM [facility] NURSE [facility]  02/22/2022  2:30 PM [facility] US2 [facility]  02/28/2022  3:30 PM [facility]URSE [facility]  02/28/2022  3:45 PM [facility] US5 [facility]  03/07/2022  2:15 PM Warden Fillers, MD [facility]  03/21/2022  2:15 PM Reva Bores, MD [facility]  03/28/2022 10:35 AM Hermina Staggers, MD [facility]  04/04/2022  2:15 PM Reva Bores, MD [facility]  04/11/2022  2:15 PM Reva Bores, MD [facility]    Federico Flake, MD

## 2022-02-15 ENCOUNTER — Ambulatory Visit: Payer: Self-pay | Admitting: *Deleted

## 2022-02-15 ENCOUNTER — Ambulatory Visit: Payer: Medicaid Other | Attending: Obstetrics

## 2022-02-15 VITALS — BP 113/62 | HR 101

## 2022-02-15 DIAGNOSIS — O24415 Gestational diabetes mellitus in pregnancy, controlled by oral hypoglycemic drugs: Secondary | ICD-10-CM

## 2022-02-15 DIAGNOSIS — O09893 Supervision of other high risk pregnancies, third trimester: Secondary | ICD-10-CM

## 2022-02-15 DIAGNOSIS — O09513 Supervision of elderly primigravida, third trimester: Secondary | ICD-10-CM

## 2022-02-15 DIAGNOSIS — O09523 Supervision of elderly multigravida, third trimester: Secondary | ICD-10-CM | POA: Insufficient documentation

## 2022-02-15 DIAGNOSIS — Z3A32 32 weeks gestation of pregnancy: Secondary | ICD-10-CM

## 2022-02-15 DIAGNOSIS — O0990 Supervision of high risk pregnancy, unspecified, unspecified trimester: Secondary | ICD-10-CM

## 2022-02-21 ENCOUNTER — Telehealth (INDEPENDENT_AMBULATORY_CARE_PROVIDER_SITE_OTHER): Payer: Medicaid Other | Admitting: Obstetrics & Gynecology

## 2022-02-21 ENCOUNTER — Encounter: Payer: Self-pay | Admitting: General Practice

## 2022-02-21 VITALS — BP 107/68 | HR 93 | Wt 126.1 lb

## 2022-02-21 DIAGNOSIS — Z23 Encounter for immunization: Secondary | ICD-10-CM

## 2022-02-21 DIAGNOSIS — O0993 Supervision of high risk pregnancy, unspecified, third trimester: Secondary | ICD-10-CM

## 2022-02-21 DIAGNOSIS — O24419 Gestational diabetes mellitus in pregnancy, unspecified control: Secondary | ICD-10-CM

## 2022-02-21 DIAGNOSIS — O09529 Supervision of elderly multigravida, unspecified trimester: Secondary | ICD-10-CM

## 2022-02-21 DIAGNOSIS — O09523 Supervision of elderly multigravida, third trimester: Secondary | ICD-10-CM

## 2022-02-21 DIAGNOSIS — Z3A32 32 weeks gestation of pregnancy: Secondary | ICD-10-CM

## 2022-02-21 DIAGNOSIS — O0990 Supervision of high risk pregnancy, unspecified, unspecified trimester: Secondary | ICD-10-CM

## 2022-02-21 DIAGNOSIS — O283 Abnormal ultrasonic finding on antenatal screening of mother: Secondary | ICD-10-CM

## 2022-02-21 DIAGNOSIS — O99810 Abnormal glucose complicating pregnancy: Secondary | ICD-10-CM

## 2022-02-21 DIAGNOSIS — Z8632 Personal history of gestational diabetes: Secondary | ICD-10-CM

## 2022-02-21 NOTE — Progress Notes (Signed)
C/o still having diarrhea since she started metformin.

## 2022-02-21 NOTE — Progress Notes (Signed)
   PRENATAL VISIT NOTE  Subjective:  Doris Hendricks is a 36 y.o. G3P2002 at [redacted]w[redacted]d being seen today for ongoing prenatal care.  She is currently monitored for the following issues for this high-risk pregnancy and has History of gestational diabetes mellitus in last pregnancy; Abnormal glucose in pregnancy, antepartum; Supervision of high risk pregnancy, antepartum; Gestational diabetes mellitus; Advanced maternal age in multigravida; and Echogenic intracardiac focus of fetus on prenatal ultrasound on their problem list.  Patient reports  occasional diarrhea with metformin .  Contractions: Not present. Vag. Bleeding: None.  Movement: Present. Denies leaking of fluid.   The following portions of the patient's history were reviewed and updated as appropriate: allergies, current medications, past family history, past medical history, past social history, past surgical history and problem list.   Objective:   Vitals:   02/21/22 1559  BP: 107/68  Pulse: 93  Weight: 126 lb 1.6 oz (57.2 kg)    Fetal Status: Fetal Heart Rate (bpm): 130   Movement: Present     General:  Alert, oriented and cooperative. Patient is in no acute distress.  Skin: Skin is warm and dry. No rash noted.   Cardiovascular: Normal heart rate noted  Respiratory: Normal respiratory effort, no problems with respiration noted  Abdomen: Soft, gravid, appropriate for gestational age.  Pain/Pressure: Absent     Pelvic: Cervical exam deferred        Extremities: Normal range of motion.  Edema: None  Mental Status: Normal mood and affect. Normal behavior. Normal judgment and thought content.   Assessment and Plan:  Pregnancy: G3P2002 at [redacted]w[redacted]d 1. Supervision of high risk pregnancy, antepartum  - Tdap vaccine greater than or equal to 7yo IM  2. Abnormal glucose in pregnancy, antepartum FBS in range and pp > 90 % in range  3. History of gestational diabetes mellitus in last pregnancy   4. Gestational diabetes mellitus  (GDM), antepartum, gestational diabetes method of control unspecified Recommend continue metformin unless GI sx worsen  5. Antepartum multigravida of advanced maternal age   4. Echogenic intracardiac focus of fetus on prenatal ultrasound   Preterm labor symptoms and general obstetric precautions including but not limited to vaginal bleeding, contractions, leaking of fluid and fetal movement were reviewed in detail with the patient. Please refer to After Visit Summary for other counseling recommendations.   Return in about 2 weeks (around 03/07/2022).  Future Appointments  Date Time Provider Department Center  02/22/2022  2:15 PM [facility] NURSE [facility]  02/22/2022  2:30 PM [facility] US2 [facility]  02/28/2022  3:30 PM [facility] NURSE [facility]  02/28/2022  3:45 PM [facility] US5 [facility]  03/07/2022  2:15 PM Warden Fillers, MD [facility]  03/21/2022  2:15 PM Reva Bores, MD [facility]  03/28/2022 10:35 AM Hermina Staggers, MD [facility]  04/04/2022  2:15 PM Reva Bores, MD [facility]  04/11/2022  2:15 PM Reva Bores, MD [facility]    Scheryl Darter, MD

## 2022-02-22 ENCOUNTER — Ambulatory Visit: Payer: Self-pay

## 2022-02-22 ENCOUNTER — Ambulatory Visit: Payer: Self-pay | Attending: Obstetrics

## 2022-02-28 ENCOUNTER — Ambulatory Visit: Payer: Self-pay | Admitting: *Deleted

## 2022-02-28 ENCOUNTER — Ambulatory Visit: Payer: Medicaid Other | Attending: Obstetrics

## 2022-02-28 ENCOUNTER — Other Ambulatory Visit: Payer: Self-pay | Admitting: Obstetrics

## 2022-02-28 VITALS — BP 110/70 | HR 102

## 2022-02-28 DIAGNOSIS — O0990 Supervision of high risk pregnancy, unspecified, unspecified trimester: Secondary | ICD-10-CM

## 2022-02-28 DIAGNOSIS — O09523 Supervision of elderly multigravida, third trimester: Secondary | ICD-10-CM

## 2022-02-28 DIAGNOSIS — O24419 Gestational diabetes mellitus in pregnancy, unspecified control: Secondary | ICD-10-CM

## 2022-02-28 DIAGNOSIS — O24415 Gestational diabetes mellitus in pregnancy, controlled by oral hypoglycemic drugs: Secondary | ICD-10-CM

## 2022-02-28 DIAGNOSIS — Z3A33 33 weeks gestation of pregnancy: Secondary | ICD-10-CM | POA: Diagnosis not present

## 2022-02-28 DIAGNOSIS — O09529 Supervision of elderly multigravida, unspecified trimester: Secondary | ICD-10-CM

## 2022-03-01 ENCOUNTER — Other Ambulatory Visit: Payer: Self-pay | Admitting: *Deleted

## 2022-03-01 DIAGNOSIS — O24419 Gestational diabetes mellitus in pregnancy, unspecified control: Secondary | ICD-10-CM

## 2022-03-01 DIAGNOSIS — O09523 Supervision of elderly multigravida, third trimester: Secondary | ICD-10-CM

## 2022-03-07 ENCOUNTER — Other Ambulatory Visit: Payer: Self-pay

## 2022-03-07 ENCOUNTER — Ambulatory Visit (INDEPENDENT_AMBULATORY_CARE_PROVIDER_SITE_OTHER): Payer: Self-pay | Admitting: Obstetrics and Gynecology

## 2022-03-07 VITALS — BP 115/79 | HR 87 | Wt 128.5 lb

## 2022-03-07 DIAGNOSIS — O283 Abnormal ultrasonic finding on antenatal screening of mother: Secondary | ICD-10-CM

## 2022-03-07 DIAGNOSIS — O0990 Supervision of high risk pregnancy, unspecified, unspecified trimester: Secondary | ICD-10-CM

## 2022-03-07 DIAGNOSIS — O09523 Supervision of elderly multigravida, third trimester: Secondary | ICD-10-CM

## 2022-03-07 DIAGNOSIS — Z3A34 34 weeks gestation of pregnancy: Secondary | ICD-10-CM

## 2022-03-07 DIAGNOSIS — O24415 Gestational diabetes mellitus in pregnancy, controlled by oral hypoglycemic drugs: Secondary | ICD-10-CM

## 2022-03-07 NOTE — Progress Notes (Signed)
   PRENATAL VISIT NOTE  Subjective:  Doris Hendricks is a 36 y.o. G3P2002 at [redacted]w[redacted]d being seen today for ongoing prenatal care.  She is currently monitored for the following issues for this high-risk pregnancy and has History of gestational diabetes mellitus in last pregnancy; Supervision of high risk pregnancy, antepartum; Gestational diabetes mellitus; Advanced maternal age in multigravida; and Echogenic intracardiac focus of fetus on prenatal ultrasound on their problem list.  Patient doing well with no acute concerns today. She reports no complaints.  Contractions: Not present. Vag. Bleeding: None.  Movement: Present. Denies leaking of fluid.   The following portions of the patient's history were reviewed and updated as appropriate: allergies, current medications, past family history, past medical history, past social history, past surgical history and problem list. Problem list updated.  Objective:   Vitals:   03/07/22 1427  BP: 115/79  Pulse: 87  Weight: 128 lb 8 oz (58.3 kg)    Fetal Status: Fetal Heart Rate (bpm): 138 Fundal Height: 35 cm Movement: Present     General:  Alert, oriented and cooperative. Patient is in no acute distress.  Skin: Skin is warm and dry. No rash noted.   Cardiovascular: Normal heart rate noted  Respiratory: Normal respiratory effort, no problems with respiration noted  Abdomen: Soft, gravid, appropriate for gestational age.  Pain/Pressure: Absent     Pelvic: Cervical exam deferred        Extremities: Normal range of motion.  Edema: None  Mental Status:  Normal mood and affect. Normal behavior. Normal judgment and thought content.   Assessment and Plan:  Pregnancy: G3P2002 at [redacted]w[redacted]d  1. Supervision of high risk pregnancy, antepartum Continue routine prenatal care  2. [redacted] weeks gestation of pregnancy   3. Gestational diabetes mellitus (GDM) in third trimester controlled on oral hypoglycemic drug FBS: 75-95 PPBS: 69-116 Good blood sugar  control Continue weekly BPP Fetal growth is appropriate  4. Multigravida of advanced maternal age in third trimester   5. Echogenic intracardiac focus of fetus on prenatal ultrasound   Preterm labor symptoms and general obstetric precautions including but not limited to vaginal bleeding, contractions, leaking of fluid and fetal movement were reviewed in detail with the patient.  Please refer to After Visit Summary for other counseling recommendations.   Return in about 1 week (around 03/14/2022) for [facility], in person, 36 weeks swabs.   Mariel Aloe, MD Faculty Attending Center for [facility]

## 2022-03-08 ENCOUNTER — Ambulatory Visit: Payer: Self-pay | Admitting: *Deleted

## 2022-03-08 ENCOUNTER — Ambulatory Visit: Payer: Medicaid Other | Attending: Obstetrics and Gynecology

## 2022-03-08 VITALS — BP 116/68 | HR 92

## 2022-03-08 DIAGNOSIS — O0990 Supervision of high risk pregnancy, unspecified, unspecified trimester: Secondary | ICD-10-CM

## 2022-03-08 DIAGNOSIS — O24415 Gestational diabetes mellitus in pregnancy, controlled by oral hypoglycemic drugs: Secondary | ICD-10-CM | POA: Insufficient documentation

## 2022-03-08 DIAGNOSIS — O09523 Supervision of elderly multigravida, third trimester: Secondary | ICD-10-CM

## 2022-03-11 ENCOUNTER — Other Ambulatory Visit: Payer: Self-pay | Admitting: *Deleted

## 2022-03-11 DIAGNOSIS — O24415 Gestational diabetes mellitus in pregnancy, controlled by oral hypoglycemic drugs: Secondary | ICD-10-CM

## 2022-03-11 DIAGNOSIS — O09523 Supervision of elderly multigravida, third trimester: Secondary | ICD-10-CM

## 2022-03-15 ENCOUNTER — Ambulatory Visit: Payer: Medicaid Other | Attending: Obstetrics and Gynecology

## 2022-03-15 ENCOUNTER — Ambulatory Visit: Payer: Self-pay | Admitting: *Deleted

## 2022-03-15 VITALS — BP 117/75 | HR 81

## 2022-03-15 DIAGNOSIS — O285 Abnormal chromosomal and genetic finding on antenatal screening of mother: Secondary | ICD-10-CM | POA: Diagnosis not present

## 2022-03-15 DIAGNOSIS — Z3A36 36 weeks gestation of pregnancy: Secondary | ICD-10-CM

## 2022-03-15 DIAGNOSIS — D573 Sickle-cell trait: Secondary | ICD-10-CM | POA: Diagnosis not present

## 2022-03-15 DIAGNOSIS — O09523 Supervision of elderly multigravida, third trimester: Secondary | ICD-10-CM

## 2022-03-15 DIAGNOSIS — O24415 Gestational diabetes mellitus in pregnancy, controlled by oral hypoglycemic drugs: Secondary | ICD-10-CM | POA: Diagnosis not present

## 2022-03-15 DIAGNOSIS — O0990 Supervision of high risk pregnancy, unspecified, unspecified trimester: Secondary | ICD-10-CM

## 2022-03-15 DIAGNOSIS — O24419 Gestational diabetes mellitus in pregnancy, unspecified control: Secondary | ICD-10-CM | POA: Insufficient documentation

## 2022-03-21 ENCOUNTER — Other Ambulatory Visit (HOSPITAL_COMMUNITY)
Admission: RE | Admit: 2022-03-21 | Discharge: 2022-03-21 | Disposition: A | Discharge status: Home / Self Care | Payer: Self-pay | Source: Ambulatory Visit | Attending: Family Medicine | Admitting: Family Medicine

## 2022-03-21 ENCOUNTER — Encounter: Payer: Self-pay | Admitting: Obstetrics and Gynecology

## 2022-03-21 ENCOUNTER — Ambulatory Visit (INDEPENDENT_AMBULATORY_CARE_PROVIDER_SITE_OTHER): Payer: Self-pay | Admitting: Obstetrics and Gynecology

## 2022-03-21 ENCOUNTER — Other Ambulatory Visit: Payer: Self-pay

## 2022-03-21 VITALS — BP 114/78 | HR 99 | Wt 201.3 lb

## 2022-03-21 DIAGNOSIS — Z3A36 36 weeks gestation of pregnancy: Secondary | ICD-10-CM

## 2022-03-21 DIAGNOSIS — O09523 Supervision of elderly multigravida, third trimester: Secondary | ICD-10-CM

## 2022-03-21 DIAGNOSIS — O0990 Supervision of high risk pregnancy, unspecified, unspecified trimester: Secondary | ICD-10-CM

## 2022-03-21 DIAGNOSIS — O24415 Gestational diabetes mellitus in pregnancy, controlled by oral hypoglycemic drugs: Secondary | ICD-10-CM

## 2022-03-21 DIAGNOSIS — O0993 Supervision of high risk pregnancy, unspecified, third trimester: Secondary | ICD-10-CM

## 2022-03-21 NOTE — Progress Notes (Signed)
Pt has Glucose log

## 2022-03-21 NOTE — Progress Notes (Signed)
Subjective:  Doris Hendricks is a 36 y.o. G3P2002 at [redacted]w[redacted]d being seen today for ongoing prenatal care.  She is currently monitored for the following issues for this high-risk pregnancy and has History of gestational diabetes mellitus in last pregnancy; Supervision of high risk pregnancy, antepartum; Gestational diabetes mellitus; Advanced maternal age in multigravida; and Echogenic intracardiac focus of fetus on prenatal ultrasound on their problem list.  Patient reports general discomforts of pregnancy.  Contractions: Not present. Vag. Bleeding: None.  Movement: Present. Denies leaking of fluid.   The following portions of the patient's history were reviewed and updated as appropriate: allergies, current medications, past family history, past medical history, past social history, past surgical history and problem list. Problem list updated.  Objective:   Vitals:   03/21/22 1415  BP: 114/78  Pulse: 99  Weight: 201 lb 4.8 oz (91.3 kg)    Fetal Status: Fetal Heart Rate (bpm): 136   Movement: Present     General:  Alert, oriented and cooperative. Patient is in no acute distress.  Skin: Skin is warm and dry. No rash noted.   Cardiovascular: Normal heart rate noted  Respiratory: Normal respiratory effort, no problems with respiration noted  Abdomen: Soft, gravid, appropriate for gestational age. Pain/Pressure: Present     Pelvic:  Cervical exam performed        Extremities: Normal range of motion.  Edema: None  Mental Status: Normal mood and affect. Normal behavior. Normal judgment and thought content.   Urinalysis:      Assessment and Plan:  Pregnancy: G3P2002 at [redacted]w[redacted]d  1. Supervision of high risk pregnancy, antepartum Stable Labor precautions - GC/Chlamydia probe amp ([place])not at [facility] - Culture, beta strep (group b only)  2. Gestational diabetes mellitus (GDM) in third trimester controlled on oral hypoglycemic drug CBG's in goal range Continue with Metformin Serial  growth scans and antenatal testing as per protocol  3. Multigravida of advanced maternal age in third trimester Stable  Live interrupter used during today's visit  Term labor symptoms and general obstetric precautions including but not limited to vaginal bleeding, contractions, leaking of fluid and fetal movement were reviewed in detail with the patient. Please refer to After Visit Summary for other counseling recommendations.  Return in about 1 week (around 03/28/2022) for OB visit, face to face, MD only.   Hermina Staggers, MD

## 2022-03-21 NOTE — Patient Instructions (Signed)
Parto vaginal Vaginal Delivery  Parto vaginal significa que usted da a luz empujando al beb fuera del canal del parto (vagina). Su equipo de atencin mdica la ayudar antes, durante y despus del parto vaginal. Las experiencias de los nacimientos son nicas para todas las [place], y Engineer, technical sales y las experiencias de nacimiento varan segn dnde elija dar a luz. Cules son los riesgos y beneficios? En general, esto es seguro. Sin embargo, pueden ocurrir complicaciones, por ejemplo: Sangrado. Infeccin. Dao a otras estructuras, como desgarro vaginal. Reacciones alrgicas a los medicamentos. A pesar de los riesgos, los beneficios del parto vaginal incluyen un menor riesgo de sangrado e infeccin y un menor tiempo de recuperacin en comparacin con el parto por cesrea. El parto por cesrea es el parto del beb por medios quirrgicos. Sander Nephew ocurrir cuando llegue al centro de [place] o al hospital? [place]ene [place] que se inicie el [place] de parto y haya sido admitida en el hospital o centro de [place], su equipo de atencin mdica podr hacer lo siguiente: Revisar sus antecedentes de Media planner y cualquier inquietud que usted pueda Best boy. Hablar con usted sobre su plan de nacimiento y Physiological scientist las opciones para Financial controller. Controlarle la presin arterial, la respiracin y los latidos cardacos. Evaluar los latidos cardacos del beb. Monitorear el tero para TEFL teacher. Verificar si la bolsa de agua (saco amnitico) se ha roto (ruptura). Colocarle una va intravenosa en una de las venas. Esto se podr usar para administrarle lquidos y medicamentos. Monitoreo Su equipo de atencin mdica puede Chief of Staff las contracciones (monitoreo uterino) y la frecuencia cardaca del beb (monitoreo fetal). Es posible que el monitoreo se necesite realizar: Con frecuencia, pero no continuamente (intermitentemente). Todo el tiempo o durante largos perodos a la vez (continuamente). El monitoreo  continuo puede ser necesario si: Est recibiendo determinados medicamentos, tales como medicamentos para Best boy o para hacer que las contracciones sean ms fuertes. Tiene complicaciones durante el [place]. El monitoreo se puede realizar: Al colocar un estetoscopio especial o un dispositivo manual de monitoreo en el abdomen o verificar los latidos cardacos del beb y comprobar las contracciones. Al colocar monitores en el abdomen (monitores externos) para Museum/gallery curator los latidos cardacos del beb y la frecuencia y [place] contracciones. Al colocar monitores dentro del tero a travs de la vagina (monitores internos) para Museum/gallery curator los latidos cardacos del beb y la frecuencia, duracin y fuerza de sus contracciones. Segn el tipo de monitor, International aid/development worker en el tero o en la cabeza del beb hasta el nacimiento. Telemetra. Se trata de un tipo de monitoreo continuo que se puede Optometrist con monitores externos o internos. En lugar de Passenger transport manager en la cama, usted puede desplazarse. Examen fsico Su equipo de atencin mdica puede Human resources officer fsicos frecuentes. Esto puede incluir: Musician cmo y dnde el beb est ubicado en el tero. Verificar el cuello uterino para determinar: Si se est afinando o estirando (borrando). Si se est abriendo (dilatando). [place] parto y [place]?  [place] de parto y el parto normales se dividen en tres etapas: Etapa 1 Esta es la etapa ms larga del trabajo de [place]. Durante esta etapa, sentir contracciones. En general, las contracciones son leves, infrecuentes e irregulares al principio. Se hacen ms fuertes, ms frecuentes y ms regulares a medida que avanza en esta etapa. Puede tener contracciones cada 2 o 3 minutos. Esta etapa finaliza cuando el cuello uterino est completamente  dilatado hasta 4 pulgadas (10 cm) y completamente borrado. Etapa 2 Esta etapa comienza una  vez que el cuello uterino est totalmente borrado y dilatado, y dura hasta el nacimiento del beb. Esta es la etapa en la que va a sentir ganas de pujar al beb fuera de la vagina. Puede sentir un dolor urente y por estiramiento, especialmente cuando la parte ms ancha de la cabeza del beb pasa a travs de la abertura vaginal (coronacin). Una vez que el beb nace, el cordn umbilical se pinzar y se cortar. El momento de cortar el cordn depender de sus deseos, de la salud del beb y de los mtodos del mdico. Clinical biochemist al beb sobre su pecho desnudo (contacto piel con piel) en una posicin erguida y [place] con [place] abrigada. Si optar por amamantar, observe al beb para detectar seales de hambre, como el reflejo de bsqueda o succin, y acrquelo al pecho para su primera alimentacin. Etapa 3 Esta etapa comienza inmediatamente despus del nacimiento del beb y finaliza despus de la expulsin de la placenta. Esta etapa puede durar de 5 a 30 minutos. Despus del nacimiento del beb, puede sentir contracciones cuando el cuerpo expulsa la placenta. Estas contracciones tambin ayudan a que el tero reduzca su tamao y a Warehouse manager. Qu puedo esperar despus del Aleen Campi de parto y [place]? Una vez que termina el [place] de [place], se los evaluar a usted y al beb atentamente hasta que estn listos para ir a casa. Su equipo de atencin Art gallery manager cmo cuidarse y cuidar a su beb. Le recomendarn que usted y el beb permanezcan en la misma sala (cohabitacin) durante su estada en el hospital. Esto ayudar a fomentar un vnculo temprano y Administrator, Civil Service. Se le controlar y Engineer, maintenance (IT) el tero con regularidad (masaje fndico). Puede seguir recibiendo lquidos o medicamentos por va intravenosa. Tendr algo de inflamacin y dolor en el abdomen, la vagina y la zona de la piel entre la abertura vaginal y el ano (perineo). Si se le realiz una incisin cerca de la vagina  (episiotoma) o si ha tenido Systems developer vaginal durante el parto, podran indicarle que se coloque compresas fras sobre la episiotoma o Art therapist. Esto ayuda a Engineer, materials y la hinchazn. Es normal tener hemorragia vaginal despus del [place]. Use un apsito sanitario para el sangrado vaginal y secrecin. Resumen Parto vaginal significa que usted dar a luz empujando al beb fuera del canal del parto (vagina). Su equipo de atencin mdica lo controlar a usted y al beb durante las etapas del [place]. Despus del parto, su equipo de atencin mdica continuar evalundolos a usted y al beb para asegurarse de que ambos se estn recuperando segn lo esperado despus del parto. Esta informacin no tiene Theme park manager el consejo del mdico. Asegrese de hacerle al mdico cualquier pregunta que tenga. Document Revised: 07/04/2020 Document Reviewed: 07/04/2020 Elsevier Patient Education  2023 [employer].

## 2022-03-22 ENCOUNTER — Ambulatory Visit: Payer: Medicaid Other | Attending: Obstetrics and Gynecology

## 2022-03-22 ENCOUNTER — Ambulatory Visit: Payer: Self-pay | Admitting: *Deleted

## 2022-03-22 VITALS — BP 118/78 | HR 85

## 2022-03-22 DIAGNOSIS — O24415 Gestational diabetes mellitus in pregnancy, controlled by oral hypoglycemic drugs: Secondary | ICD-10-CM

## 2022-03-22 DIAGNOSIS — O24419 Gestational diabetes mellitus in pregnancy, unspecified control: Secondary | ICD-10-CM | POA: Diagnosis not present

## 2022-03-22 DIAGNOSIS — O09523 Supervision of elderly multigravida, third trimester: Secondary | ICD-10-CM | POA: Insufficient documentation

## 2022-03-22 DIAGNOSIS — O0990 Supervision of high risk pregnancy, unspecified, unspecified trimester: Secondary | ICD-10-CM | POA: Insufficient documentation

## 2022-03-22 LAB — GC/CHLAMYDIA PROBE AMP (~~LOC~~) NOT AT ARMC
Chlamydia: NEGATIVE
Comment: NEGATIVE
Comment: NORMAL
Neisseria Gonorrhea: NEGATIVE

## 2022-03-25 LAB — CULTURE, BETA STREP (GROUP B ONLY): Strep Gp B Culture: NEGATIVE

## 2022-03-28 ENCOUNTER — Telehealth: Payer: Self-pay | Admitting: Obstetrics and Gynecology

## 2022-03-29 ENCOUNTER — Ambulatory Visit: Payer: Self-pay | Attending: Maternal & Fetal Medicine | Admitting: *Deleted

## 2022-03-29 ENCOUNTER — Ambulatory Visit: Payer: Self-pay | Admitting: *Deleted

## 2022-03-29 VITALS — BP 123/76 | HR 86

## 2022-03-29 DIAGNOSIS — Z3A38 38 weeks gestation of pregnancy: Secondary | ICD-10-CM | POA: Insufficient documentation

## 2022-03-29 DIAGNOSIS — O24415 Gestational diabetes mellitus in pregnancy, controlled by oral hypoglycemic drugs: Secondary | ICD-10-CM

## 2022-03-29 DIAGNOSIS — O0990 Supervision of high risk pregnancy, unspecified, unspecified trimester: Secondary | ICD-10-CM

## 2022-03-29 DIAGNOSIS — O09523 Supervision of elderly multigravida, third trimester: Secondary | ICD-10-CM

## 2022-03-29 DIAGNOSIS — O24419 Gestational diabetes mellitus in pregnancy, unspecified control: Secondary | ICD-10-CM | POA: Insufficient documentation

## 2022-03-29 NOTE — Procedures (Signed)
Doris Hendricks [birth year] [redacted]w[redacted]d  Fetus A Non-Stress Test Interpretation for 03/29/22  Indication: Advanced Maternal Age >40 years and gestational diabetes  Fetal Heart Rate A Mode: External Baseline Rate (A): 135 bpm Variability: Moderate Accelerations: 15 x 15 Decelerations: None Multiple birth?: No  Uterine Activity Mode: Toco Contraction Frequency (min): one u/c in 25 min Contraction Duration (sec): 65 Contraction Quality: Mild (not felt by pt) Resting Tone Palpated: Relaxed  Interpretation (Fetal Testing) Nonstress Test Interpretation: Reactive Overall Impression: Reassuring for gestational age Comments: Tracing reviewed by Dr. Grace Bushy

## 2022-04-01 ENCOUNTER — Encounter (HOSPITAL_COMMUNITY): Payer: Self-pay | Admitting: Family Medicine

## 2022-04-01 ENCOUNTER — Inpatient Hospital Stay (HOSPITAL_COMMUNITY)
Admission: AD | Admit: 2022-04-01 | Discharge: 2022-04-02 | DRG: 807 | Disposition: A | Discharge status: Home / Self Care | Payer: Medicaid Other | Attending: Family Medicine | Admitting: Family Medicine

## 2022-04-01 DIAGNOSIS — O24415 Gestational diabetes mellitus in pregnancy, controlled by oral hypoglycemic drugs: Secondary | ICD-10-CM

## 2022-04-01 DIAGNOSIS — O26893 Other specified pregnancy related conditions, third trimester: Secondary | ICD-10-CM | POA: Diagnosis present

## 2022-04-01 DIAGNOSIS — O09523 Supervision of elderly multigravida, third trimester: Secondary | ICD-10-CM

## 2022-04-01 DIAGNOSIS — Z3A38 38 weeks gestation of pregnancy: Secondary | ICD-10-CM

## 2022-04-01 DIAGNOSIS — O24425 Gestational diabetes mellitus in childbirth, controlled by oral hypoglycemic drugs: Secondary | ICD-10-CM | POA: Diagnosis present

## 2022-04-01 DIAGNOSIS — Z7982 Long term (current) use of aspirin: Secondary | ICD-10-CM | POA: Diagnosis not present

## 2022-04-01 DIAGNOSIS — O0990 Supervision of high risk pregnancy, unspecified, unspecified trimester: Secondary | ICD-10-CM

## 2022-04-01 LAB — CBC
HCT: 38.3 % (ref 36.0–46.0)
Hemoglobin: 13.5 g/dL (ref 12.0–15.0)
MCH: 29 pg (ref 26.0–34.0)
MCHC: 35.2 g/dL (ref 30.0–36.0)
MCV: 82.2 fL (ref 80.0–100.0)
Platelets: 235 10*3/uL (ref 150–400)
RBC: 4.66 MIL/uL (ref 3.87–5.11)
RDW: 14.6 % (ref 11.5–15.5)
WBC: 7.5 10*3/uL (ref 4.0–10.5)
nRBC: 0 % (ref 0.0–0.2)

## 2022-04-01 LAB — TYPE AND SCREEN
ABO/RH(D): A POS
Antibody Screen: NEGATIVE

## 2022-04-01 LAB — COMPREHENSIVE METABOLIC PANEL
ALT: 14 U/L (ref 0–44)
AST: 16 U/L (ref 15–41)
Albumin: 2.5 g/dL — ABNORMAL LOW (ref 3.5–5.0)
Alkaline Phosphatase: 155 U/L — ABNORMAL HIGH (ref 38–126)
Anion gap: 12 (ref 5–15)
BUN: 8 mg/dL (ref 6–20)
CO2: 19 mmol/L — ABNORMAL LOW (ref 22–32)
Calcium: 8.8 mg/dL — ABNORMAL LOW (ref 8.9–10.3)
Chloride: 105 mmol/L (ref 98–111)
Creatinine, Ser: 0.53 mg/dL (ref 0.44–1.00)
GFR, Estimated: >60 mL/min (ref >60)
Glucose, Bld: 76 mg/dL (ref 70–99)
Potassium: 3.8 mmol/L (ref 3.5–5.1)
Sodium: 136 mmol/L (ref 135–145)
Total Bilirubin: 0.4 mg/dL (ref 0.3–1.2)
Total Protein: 6.3 g/dL — ABNORMAL LOW (ref 6.5–8.1)

## 2022-04-01 LAB — RPR: RPR Ser Ql: NONREACTIVE

## 2022-04-01 LAB — PROTEIN / CREATININE RATIO, URINE
Creatinine, Urine: 43 mg/dL
Total Protein, Urine: <6 mg/dL

## 2022-04-01 LAB — GLUCOSE, CAPILLARY: Glucose-Capillary: 84 mg/dL (ref 70–99)

## 2022-04-01 MED ORDER — FAMOTIDINE 20 MG PO TABS
20.0000 mg | ORAL_TABLET | Freq: Two times a day (BID) | ORAL | Status: DC
Start: 2022-04-01 — End: 2022-04-03
  Filled 2022-04-01: qty 1

## 2022-04-01 MED ORDER — OXYCODONE-ACETAMINOPHEN 5-325 MG PO TABS: 1.0000 | ORAL_TABLET | ORAL | Status: DC | PRN

## 2022-04-01 MED ORDER — OXYTOCIN 10 UNIT/ML IJ SOLN: 10.0000 [IU] | Freq: Once | INTRAMUSCULAR | Status: DC | PRN

## 2022-04-01 MED ORDER — OXYTOCIN BOLUS FROM INFUSION
333.0000 mL | Freq: Once | INTRAVENOUS | Status: AC
  Administered 2022-04-01: 333 mL via INTRAVENOUS

## 2022-04-01 MED ORDER — ONDANSETRON HCL 4 MG/2ML IJ SOLN: 4.0000 mg | Freq: Four times a day (QID) | INTRAMUSCULAR | Status: DC | PRN

## 2022-04-01 MED ORDER — SIMETHICONE 80 MG PO CHEW: 80.0000 mg | CHEWABLE_TABLET | ORAL | Status: DC | PRN

## 2022-04-01 MED ORDER — LACTATED RINGERS IV SOLN: INTRAVENOUS | Status: DC

## 2022-04-01 MED ORDER — DIPHENHYDRAMINE HCL 25 MG PO CAPS: 25.0000 mg | ORAL_CAPSULE | Freq: Four times a day (QID) | ORAL | Status: DC | PRN

## 2022-04-01 MED ORDER — ACETAMINOPHEN 325 MG PO TABS: 650.0000 mg | ORAL_TABLET | ORAL | Status: DC | PRN

## 2022-04-01 MED ORDER — SOD CITRATE-CITRIC ACID 500-334 MG/5ML PO SOLN: 30.0000 mL | ORAL | Status: DC | PRN

## 2022-04-01 MED ORDER — ONDANSETRON HCL 4 MG PO TABS: 4.0000 mg | ORAL_TABLET | ORAL | Status: DC | PRN

## 2022-04-01 MED ORDER — COCONUT OIL OIL: 1.0000 | TOPICAL_OIL | Status: DC | PRN

## 2022-04-01 MED ORDER — OXYCODONE-ACETAMINOPHEN 5-325 MG PO TABS: 2.0000 | ORAL_TABLET | ORAL | Status: DC | PRN

## 2022-04-01 MED ORDER — PRENATAL MULTIVITAMIN CH
1.0000 | ORAL_TABLET | Freq: Every day | ORAL | Status: DC
Start: 2022-04-02 — End: 2022-04-03
  Administered 2022-04-02: 1 via ORAL
  Filled 2022-04-01: qty 1

## 2022-04-01 MED ORDER — IBUPROFEN 600 MG PO TABS
600.0000 mg | ORAL_TABLET | Freq: Four times a day (QID) | ORAL | Status: DC
  Administered 2022-04-01 – 2022-04-02 (×4): 600 mg via ORAL
  Filled 2022-04-01 (×4): qty 1

## 2022-04-01 MED ORDER — FENTANYL CITRATE (PF) 100 MCG/2ML IJ SOLN: 100.0000 ug | INTRAMUSCULAR | Status: DC | PRN

## 2022-04-01 MED ORDER — SENNOSIDES-DOCUSATE SODIUM 8.6-50 MG PO TABS
2.0000 | ORAL_TABLET | Freq: Every day | ORAL | Status: DC
  Administered 2022-04-02: 2 via ORAL
  Filled 2022-04-01: qty 2

## 2022-04-01 MED ORDER — ACETAMINOPHEN 325 MG PO TABS
650.0000 mg | ORAL_TABLET | ORAL | Status: DC | PRN
  Administered 2022-04-01: 650 mg via ORAL
  Filled 2022-04-01: qty 2

## 2022-04-01 MED ORDER — OXYTOCIN-SODIUM CHLORIDE 30-0.9 UT/500ML-% IV SOLN
INTRAVENOUS | Status: AC
  Filled 2022-04-01: qty 500

## 2022-04-01 MED ORDER — ASPIRIN 81 MG PO TBEC
81.0000 mg | DELAYED_RELEASE_TABLET | Freq: Every day | ORAL | Status: DC
  Administered 2022-04-01 – 2022-04-02 (×2): 81 mg via ORAL
  Filled 2022-04-01 (×2): qty 1

## 2022-04-01 MED ORDER — LACTATED RINGERS IV SOLN: 500.0000 mL | INTRAVENOUS | Status: DC | PRN

## 2022-04-01 MED ORDER — OXYTOCIN-SODIUM CHLORIDE 30-0.9 UT/500ML-% IV SOLN
2.5000 [IU]/h | INTRAVENOUS | Status: DC
  Administered 2022-04-01: 2.5 [IU]/h via INTRAVENOUS

## 2022-04-01 MED ORDER — DIBUCAINE (PERIANAL) 1 % EX OINT: 1.0000 | TOPICAL_OINTMENT | CUTANEOUS | Status: DC | PRN

## 2022-04-01 MED ORDER — WITCH HAZEL-GLYCERIN EX PADS: 1.0000 | MEDICATED_PAD | CUTANEOUS | Status: DC | PRN

## 2022-04-01 MED ORDER — ONDANSETRON HCL 4 MG/2ML IJ SOLN: 4.0000 mg | INTRAMUSCULAR | Status: DC | PRN

## 2022-04-01 MED ORDER — BENZOCAINE-MENTHOL 20-0.5 % EX AERO
1.0000 | INHALATION_SPRAY | CUTANEOUS | Status: DC | PRN
  Administered 2022-04-01: 1 via TOPICAL
  Filled 2022-04-01: qty 56

## 2022-04-01 MED ORDER — TETANUS-DIPHTH-ACELL PERTUSSIS 5-2.5-18.5 LF-MCG/0.5 IM SUSY: 0.5000 mL | PREFILLED_SYRINGE | Freq: Once | INTRAMUSCULAR | Status: DC

## 2022-04-01 MED ORDER — LIDOCAINE HCL (PF) 1 % IJ SOLN
30.0000 mL | INTRAMUSCULAR | Status: AC | PRN
  Administered 2022-04-01: 30 mL via SUBCUTANEOUS
  Filled 2022-04-01: qty 30

## 2022-04-01 NOTE — H&P (Signed)
OBSTETRIC ADMISSION HISTORY AND PHYSICAL  Doris Hendricks is a 36 y.o. female G3P2002 with IUP at [redacted]w[redacted]d by LMP presenting for regular contractions every 1 minute that started around 0400 this morning. She reports +FMs, No LOF, no VB, no blurry vision, headaches or peripheral edema, and RUQ pain.  She plans on breast and formula feeding. She request IUD for birth control. She received her prenatal care at  [facility].    Dating: By LMP --->  Estimated Date of Delivery: 04/12/22  Sono:    @[redacted]w[redacted]d , CWD, normal anatomy, cephalic presentation, 2986g, EFW   Prenatal History/Complications: A2GDM, AMA  Past Medical History: Past Medical History:  Diagnosis Date   Medical history non-contributory     Past Surgical History: Past Surgical History:  Procedure Laterality Date   NO PAST SURGERIES      Obstetrical History: OB History     Gravida  3   Para  2   Term  2   Preterm  0   AB  0   Living  2      SAB  0   IAB  0   Ectopic  0   Multiple  0   Live Births  2           Social History Social History   Socioeconomic History   Marital status: Single    Spouse name: Not on file   Number of children: Not on file   Years of education: Not on file   Highest education level: Not on file  Occupational History   Not on file  Tobacco Use   Smoking status: Never   Smokeless tobacco: Never  Vaping Use   Vaping Use: Never used  Substance and Sexual Activity   Alcohol use: No   Drug use: No   Sexual activity: Not Currently  Other Topics Concern   Not on file  Social History Narrative   Not on file   Social Determinants of Health   Financial Resource Strain: Not on file  Food Insecurity: No Food Insecurity (02/21/2022)   Hunger Vital Sign    Worried About Running Out of Food in the Last Year: Never true    Ran Out of Food in the Last Year: Never true  Transportation Needs: No Transportation Needs (02/21/2022)   PRAPARE - 02/23/2022 (Medical): No    Lack of Transportation (Non-Medical): No  Physical Activity: Not on file  Stress: Not on file  Social Connections: Not on file    Family History: Family History  Problem Relation Age of Onset   Hypertension Mother    Asthma Neg Hx    Cancer Neg Hx    Diabetes Neg Hx    Heart disease Neg Hx    Stroke Neg Hx     Allergies: No Known Allergies  Medications Prior to Admission  Medication Sig Dispense Refill Last Dose   aspirin EC 81 MG tablet Take 1 tablet (81 mg total) by mouth daily. Swallow whole. 30 tablet 11 03/31/2022   metFORMIN (GLUCOPHAGE) 1000 MG tablet Take 1 tablet (1,000 mg total) by mouth 2 (two) times daily with a meal. 60 tablet 5 03/31/2022   prenatal vitamin w/FE, FA (PRENATAL 1 + 1) 27-1 MG TABS tablet Take 1 tablet by mouth daily at 12 noon.   03/31/2022   famotidine (PEPCID) 20 MG tablet Take 20 mg by mouth 2 (two) times daily.        Review of  Systems   All systems reviewed and negative except as stated in HPI  Blood pressure 134/86, pulse 83, temperature 97.6 F (36.4 C), temperature source Oral, resp. rate 16, last menstrual period 07/06/2021, SpO2 100 %, currently breastfeeding. General appearance: alert, cooperative, and appears stated age Lungs: clear to auscultation bilaterally Heart: regular rate and rhythm Abdomen: soft, non-tender; bowel sounds normal Pelvic: Adequate Extremities: Homans sign is negative, no sign of DVT DTR's intact Presentation: cephalic Fetal monitoringBaseline: 125 bpm, Variability: Good {> 6 bpm), Accelerations: Reactive, and Decelerations: Early Uterine activityDate/time of onset: 0400 on 04/01/22, Frequency: Every 2-3 minutes, and Duration: 45-60 seconds Dilation: 4 Effacement (%): 90 Station: -2 Exam by:: Jeanice Lim Flippin RN   Prenatal labs: ABO, Rh: --/--/A POS (08/21 6606) Antibody: NEG (08/21 0908) Rubella: Immune, Immune (03/16 0000) RPR: Nonreactive, Nonreactive (03/16 0000)   HBsAg: Negative, Negative (03/16 0000)  HIV: Non-reactive, Non-reactive (03/16 0000)  GBS: Negative/-- (08/10 1627)  1 hr Glucola 155, failed 3 hour Glucola Genetic screening  AFP normal, NIPS: Low risk panorama Anatomy US 11/30/21, normal antatomy.   Prenatal Transfer Tool  Maternal Diabetes: Yes:  Diabetes Type:  Insulin/Medication controlled Genetic Screening: Normal Maternal Ultrasounds/Referrals: Normal Fetal Ultrasounds or other Referrals:  Referred to Materal Fetal Medicine  Maternal Substance Abuse:  No Significant Maternal Medications:  None Significant Maternal Lab Results: None  Results for orders placed or performed during the hospital encounter of 04/01/22 (from the past 24 hour(s))  Protein / creatinine ratio, urine   Collection Time: 04/01/22  9:01 AM  Result Value Ref Range   Creatinine, Urine 43 mg/dL   Total Protein, Urine <6 mg/dL   Protein Creatinine Ratio        0.00 - 0.15 mg/mg[Cre]  CBC   Collection Time: 04/01/22  9:08 AM  Result Value Ref Range   WBC 7.5 4.0 - 10.5 K/uL   RBC 4.66 3.87 - 5.11 MIL/uL   Hemoglobin 13.5 12.0 - 15.0 g/dL   HCT 30.1 60.1 - 09.3 %   MCV 82.2 80.0 - 100.0 fL   MCH 29.0 26.0 - 34.0 pg   MCHC 35.2 30.0 - 36.0 g/dL   RDW 23.5 57.3 - 22.0 %   Platelets 235 150 - 400 K/uL   nRBC 0.0 0.0 - 0.2 %  Comprehensive metabolic panel   Collection Time: 04/01/22  9:08 AM  Result Value Ref Range   Sodium 136 135 - 145 mmol/L   Potassium 3.8 3.5 - 5.1 mmol/L   Chloride 105 98 - 111 mmol/L   CO2 19 (L) 22 - 32 mmol/L   Glucose, Bld 76 70 - 99 mg/dL   BUN 8 6 - 20 mg/dL   Creatinine, Ser 2.54 0.44 - 1.00 mg/dL   Calcium 8.8 (L) 8.9 - 10.3 mg/dL   Total Protein 6.3 (L) 6.5 - 8.1 g/dL   Albumin 2.5 (L) 3.5 - 5.0 g/dL   AST 16 15 - 41 U/L   ALT 14 0 - 44 U/L   Alkaline Phosphatase 155 (H) 38 - 126 U/L   Total Bilirubin 0.4 0.3 - 1.2 mg/dL   GFR, Estimated >27 >06 mL/min   Anion gap 12 5 - 15  Type and screen [facility]   Collection Time: 04/01/22  9:08 AM  Result Value Ref Range   ABO/RH(D) A POS    Antibody Screen NEG    Sample Expiration      04/04/2022,2359 Performed at [facility], [address], [place], [place] [address]     Patient Active Problem List   Diagnosis Date Noted   Echogenic intracardiac focus of fetus on prenatal ultrasound 12/01/2021   Supervision of high risk pregnancy, antepartum 11/21/2021   Gestational diabetes mellitus 11/21/2021   Advanced maternal age in multigravida 11/21/2021   History of gestational diabetes mellitus in last pregnancy 10/10/2014    Assessment/Plan:  Dashanique Hendricks is a 36 y.o. G3P2002 at [redacted]w[redacted]d here for regular contractions. Will be admitted for IOL.   #Labor:SOL #Pain: IV medications, undecided on epidural  #FWB: Cat I Tracing #ID:  GBS (-) #MOF: Breast vs bottle #MOC: IUD #Circ:  NA  Shunda Rabadi L Delphine Sizemore, MD  04/01/2022, 11:23 AM

## 2022-04-01 NOTE — Discharge Summary (Signed)
Postpartum Discharge Summary  Date of Service updated***     Patient Name: Doris Hendricks DOB: January 30, 1986 MRN: [id number]  Date of admission: 04/01/2022 Delivery date:04/01/2022  Delivering provider: Apolonio Schneiders  Date of discharge: 04/01/2022  Admitting diagnosis: Indication for care in labor or delivery [O75.9] Intrauterine pregnancy: [redacted]w[redacted]d    Secondary diagnosis:  Active Problems:   Indication for care in labor or delivery   Postpartum care following vaginal delivery   NSVD (normal spontaneous vaginal delivery)  Additional problems: ***    Discharge diagnosis: {DX.:23714}                                              Post partum procedures:{Postpartum procedures:23558} Augmentation: N/A Complications: None  Hospital course: Onset of Labor With Vaginal Delivery      36y.o. yo G3P2002 at 38w3das admitted in Active Labor on 04/01/2022. Patient had an uncomplicated labor course as follows:  Membrane Rupture Time/Date: 1:29 PM ,04/01/2022   Delivery Method:Vaginal, Spontaneous  Episiotomy: None  Lacerations:  1st degree;Perineal  Patient had an uncomplicated postpartum course.  She is ambulating, tolerating a regular diet, passing flatus, and urinating well. Patient is discharged home in stable condition on 04/01/22.  Newborn Data: Birth date:04/01/2022  Birth time:2:13 PM  Gender:Female  Living status:Living  Apgars:8 ,9  Weight:   Magnesium Sulfate received: No BMZ received: No Rhophylac:N/A MMR:N/A - Immune T-DaP:Given prenatally Flu: Yes - given prenatally Transfusion:{Transfusion received:30440034}  Physical exam  Vitals:   04/01/22 1420 04/01/22 1431 04/01/22 1445 04/01/22 1500  BP: 122/68 128/72 98/68 115/76  Pulse: 82 72 75 78  Resp:  18    Temp:      TempSrc:      SpO2:       General: {Exam; general:21111117} Lochia: {Desc; appropriate/inappropriate:30686::"appropriate"} Uterine Fundus: {Desc; firm/soft:30687} Incision: {Exam;  incision:21111123} DVT Evaluation: {Exam; dvt:2111122} Labs: Lab Results  Component Value Date   WBC 7.5 04/01/2022   HGB 13.5 04/01/2022   HCT 38.3 04/01/2022   MCV 82.2 04/01/2022   PLT 235 04/01/2022      Latest Ref Rng & Units 04/01/2022    9:08 AM  CMP  Glucose 70 - 99 mg/dL 76   BUN 6 - 20 mg/dL 8   Creatinine 0.44 - 1.00 mg/dL 0.53   Sodium 135 - 145 mmol/L 136   Potassium 3.5 - 5.1 mmol/L 3.8   Chloride 98 - 111 mmol/L 105   CO2 22 - 32 mmol/L 19   Calcium 8.9 - 10.3 mg/dL 8.8   Total Protein 6.5 - 8.1 g/dL 6.3   Total Bilirubin 0.3 - 1.2 mg/dL 0.4   Alkaline Phos 38 - 126 U/L 155   AST 15 - 41 U/L 16   ALT 0 - 44 U/L 14    Edinburgh Score:     No data to display           After visit meds:  Allergies as of 04/01/2022   No Known Allergies   Med Rec must be completed prior to [facility]        Discharge home in stable condition Infant Feeding: {Baby feeding:23562} Infant Disposition:{CHL IP OB HOME WITH MOZWCHEN:27782}ischarge instruction: per After Visit Summary and Postpartum booklet. Activity: Advance as tolerated. Pelvic rest for 6 weeks.  Diet: routine diet Future Appointments: Future Appointments  Date Time Provider Department Center  04/04/2022  2:15 PM Donnamae Jude, MD [facility]  04/11/2022  2:15 PM Donnamae Jude, MD [facility]   Follow up Visit:  Message sent to [facility] by R. Renato Battles, CNM on 04/01/2022 Please schedule this patient for a In person postpartum visit in 4 weeks with the following provider: Any provider. Additional Postpartum F/U: IUD insertion   High risk pregnancy complicated by: GDM and AMA Delivery mode:  Vaginal, Spontaneous  Anticipated Birth Control:  IUD   04/01/2022 Laury Deep, CNM

## 2022-04-01 NOTE — MAU Note (Addendum)
.  Doris Hendricks is a 36 y.o. at [redacted]w[redacted]d here in MAU reporting: ctx every 1 minute that started around 0400 this morning. Denies LOF. +FM.  Pain score: 9

## 2022-04-02 LAB — CBC
HCT: 32.5 % — ABNORMAL LOW (ref 36.0–46.0)
Hemoglobin: 11.5 g/dL — ABNORMAL LOW (ref 12.0–15.0)
MCH: 29.3 pg (ref 26.0–34.0)
MCHC: 35.4 g/dL (ref 30.0–36.0)
MCV: 82.7 fL (ref 80.0–100.0)
Platelets: 210 10*3/uL (ref 150–400)
RBC: 3.93 MIL/uL (ref 3.87–5.11)
RDW: 14.6 % (ref 11.5–15.5)
WBC: 9.9 10*3/uL (ref 4.0–10.5)
nRBC: 0 % (ref 0.0–0.2)

## 2022-04-02 MED ORDER — IBUPROFEN 600 MG PO TABS
600.0000 mg | ORAL_TABLET | Freq: Four times a day (QID) | ORAL | 0 refills | Status: AC
Start: 2022-04-02 — End: ?

## 2022-04-02 MED ORDER — SENNOSIDES-DOCUSATE SODIUM 8.6-50 MG PO TABS
2.0000 | ORAL_TABLET | Freq: Every day | ORAL | 0 refills | Status: AC
Start: 2022-04-02 — End: ?

## 2022-04-02 MED ORDER — BENZOCAINE-MENTHOL 20-0.5 % EX AERO: 1.0000 | INHALATION_SPRAY | CUTANEOUS | 0 refills | Status: AC | PRN

## 2022-04-02 MED ORDER — ACETAMINOPHEN 325 MG PO TABS: 650.0000 mg | ORAL_TABLET | ORAL | 0 refills | Status: AC | PRN

## 2022-04-02 NOTE — Lactation Note (Signed)
This note was copied from a baby's chart. Lactation Consultation Note  Patient Name: Doris Hendricks [id number] Date: 04/02/2022 Reason for consult: Initial assessment;Early term 37-38.6wks;Difficult latch;Maternal endocrine disorder (Difficult latch to right breast) Age:38 hours  LC in to visit with P3 birth parent of ET infant.  Baby has been exclusively breastfeeding and is at a 2% weight loss.  Some soreness with latching on right side which is flat and slight inversion on nipple tip.  RN finishing assessment and LC offered to assist with latching. Hand pump provided and instructed to pre-pump to help evert nipple and prime the breast.  Reviewed hand expression.   Several attempts to latch baby to right breast.  Baby latched and sucked a few times.  Colostrum present.  Baby became fussy and moved baby to left breast.  Nipple everted and areola compressible.  Baby latched in football hold with ease, and sucked for a few minutes before falling asleep and then becoming fussy. Baby placed STS on Mom's chest.  Baby burped and relaxed.  Encouraged keeping baby STS and offering the breast with cues. Encouraged offering the right breast first, after pre-pumping.  Shells provided to wear on right side.  Encouraged calling for help with latching prn.  Maternal Data Has patient been taught Hand Expression?: Yes Does the patient have breastfeeding experience prior to this delivery?: Yes How long did the patient breastfeed?: 1 year with both previous babies  Feeding Mother's Current Feeding Choice: Breast Milk and Formula  LATCH Score Latch: Repeated attempts needed to sustain latch, nipple held in mouth throughout feeding, stimulation needed to elicit sucking reflex.  Audible Swallowing: A few with stimulation (primarily on left breast)  Type of Nipple: Everted at rest and after stimulation (more flat on left breast with slight inversion)  Comfort (Breast/Nipple): Filling, red/small  blisters or bruises, mild/mod discomfort  Hold (Positioning): Assistance needed to correctly position infant at breast and maintain latch.  LATCH Score: 6   Lactation Tools Discussed/Used Tools: Shells;Pump;Flanges Flange Size: 24 Breast pump type: Manual Pump Education: Setup, frequency, and cleaning Reason for Pumping: pre-pump on right breast Pumping frequency: pre-pump before latching to right breast  Interventions Interventions: Breast feeding basics reviewed;Assisted with latch;Skin to skin;Breast massage;Hand express;Pre-pump if needed;Adjust position;Support pillows;Position options;Shells;Hand pump;LC Services brochure  Discharge Pump: Manual  Consult Status Consult Status: Follow-up Date: 04/03/22 Follow-up type: In-patient    Judee Clara 04/02/2022, 8:46 AM

## 2022-04-02 NOTE — Progress Notes (Signed)
POSTPARTUM PROGRESS NOTE  Post Partum Day 1  Subjective:  Doris Hendricks is a 36 y.o. [id number] s/p SVD at [redacted]w[redacted]d.  She reports she is doing well. No acute events overnight. She denies any problems with ambulating, voiding or po intake. Denies nausea or vomiting.  Pain is well controlled.  Lochia is adequate.  Objective: Blood pressure 99/61, pulse 73, temperature 98.4 F (36.9 C), temperature source Oral, resp. rate 18, last menstrual period 07/06/2021, SpO2 100 %, unknown if currently breastfeeding.  Physical Exam:  General: alert, cooperative and no distress Chest: no respiratory distress Heart:regular rate, distal pulses intact Uterine Fundus: firm, appropriately tender DVT Evaluation: No calf swelling or tenderness Extremities: no edema Skin: warm, dry  Recent Labs    04/01/22 0908 04/02/22 0421  HGB 13.5 11.5*  HCT 38.3 32.5*    Assessment/Plan: Doris Hendricks is a 36 y.o. [id number] s/p SVD at [redacted]w[redacted]d   PPD#1 - Doing well  Routine postpartum care Contraception: IUD outpt Feeding: Breast Dispo: Plan for discharge tomorrow.   LOS: 1 day   Myrtie Hawk, DO OB Fellow  04/02/2022, 7:46 AM

## 2022-04-04 ENCOUNTER — Encounter: Payer: Self-pay | Admitting: Family Medicine

## 2022-04-05 ENCOUNTER — Ambulatory Visit: Payer: Self-pay

## 2022-04-05 ENCOUNTER — Other Ambulatory Visit: Payer: Self-pay

## 2022-04-09 ENCOUNTER — Telehealth (HOSPITAL_COMMUNITY): Payer: Self-pay

## 2022-04-09 NOTE — Telephone Encounter (Signed)
Patient reports feeling good. "How long does the bleeding last?" RN reviewed normal lochia duration. Patient declines any other questions/concerns about her health and healing.  Patient reports that baby is doing good. "She is eating every 2 hours and she gained her weight back." Baby sleeps in a crib. RN reviewed ABC's of safe sleep with patient. Patient declines any questions or concerns about baby.  EPDS score is 0.  Marcelino Duster [facility]  04/09/22,1548

## 2022-04-11 ENCOUNTER — Encounter: Payer: Self-pay | Admitting: Family Medicine

## 2022-05-02 ENCOUNTER — Ambulatory Visit: Payer: Self-pay | Admitting: Obstetrics and Gynecology

## 2022-05-17 ENCOUNTER — Ambulatory Visit (INDEPENDENT_AMBULATORY_CARE_PROVIDER_SITE_OTHER): Payer: Medicaid Other | Admitting: Medical

## 2022-05-17 ENCOUNTER — Encounter: Payer: Self-pay | Admitting: Medical

## 2022-05-17 DIAGNOSIS — Z8632 Personal history of gestational diabetes: Secondary | ICD-10-CM | POA: Diagnosis not present

## 2022-05-17 DIAGNOSIS — Z30430 Encounter for insertion of intrauterine contraceptive device: Secondary | ICD-10-CM | POA: Diagnosis not present

## 2022-05-17 MED ORDER — LEVONORGESTREL 20.1 MCG/DAY IU IUD
1.0000 | INTRAUTERINE_SYSTEM | Freq: Once | INTRAUTERINE | Status: AC
  Administered 2022-05-17: 1 via INTRAUTERINE

## 2022-05-17 NOTE — Progress Notes (Signed)
[place] Partum Visit Note  Doris Hendricks is a 36 y.o. [id number] female who presents for a postpartum visit. She is 6 weeks postpartum following a normal spontaneous vaginal delivery.  I have fully reviewed the prenatal and intrapartum course. The delivery was at [redacted]w[redacted]d gestational weeks.  Anesthesia: local. Postpartum course has been going well. Baby is doing well. Baby is feeding by both breast and bottle - Enfamil AR. Bleeding no bleeding. Bowel function is normal. Bladder function is normal. Patient is not sexually active. Contraception method is abstinence. Postpartum depression screening: negative.   [facility] - 05/17/22 1146       Pregnancy Intention Screening   Does the patient want to become pregnant in the next year? No    Does the patient's partner want to become pregnant in the next year? No    Would the patient like to discuss contraceptive options today? Yes      Contraception Wrap Up   Current Method Abstinence    End Method IUD or IUS    Contraception Counseling Provided Yes    How was the end contraceptive method provided? Provided on site            The pregnancy intention screening data noted above was reviewed. Potential methods of contraception were discussed. The patient elected to proceed with IUD or IUS.    Health Maintenance Due  Topic Date Due   COVID-19 Vaccine (1) Never done   FOOT EXAM  Never done   OPHTHALMOLOGY EXAM  Never done   INFLUENZA VACCINE  03/12/2022    The following portions of the patient's history were reviewed and updated as appropriate: allergies, current medications, past family history, past medical history, past social history, past surgical history, and problem list.  Review of Systems Pertinent items are noted in HPI.  Objective:  BP 120/86   Pulse 96   LMP 07/06/2021   Breastfeeding Yes    General:  alert and cooperative   Breasts:  not indicated  Lungs: clear to auscultation bilaterally  Heart:  regular rate  and rhythm, S1, S2 normal, no murmur, click, rub or gallop  Abdomen: soft, non-tender; bowel sounds normal; no masses,  no organomegaly   Wound N/A  GU exam:  normal        IUD Insertion Procedure Note Patient identified, informed consent performed.  Discussed risks of irregular bleeding, cramping, infection, malpositioning or misplacement of the IUD outside the uterus which may require further procedure such as laparoscopy. Time out was performed.  Urine pregnancy test negative.  Speculum placed in the vagina.  Cervix visualized.  Cleaned with Betadine x 2.  Grasped anteriorly with a single tooth tenaculum.  Uterus sounded to 7 cm.  [place] IUD placed per manufacturer's recommendations.  Strings trimmed to 3 cm. Tenaculum was removed, good hemostasis noted.  Patient tolerated procedure well.   Patient was given post-procedure instructions.  She was advised to be have backup contraception for one week.  Patient was also asked to check IUD strings periodically and follow up in 4 weeks for IUD check.  Assessment:   Encounter Diagnoses  Name Primary?   Routine postpartum follow-up Yes   History of gestational diabetes mellitus in last pregnancy    Encounter for IUD insertion      Plan:   Essential components of care per ACOG recommendations:  1.  Mood and well being: Patient with negative depression screening today. Reviewed local resources for support.  - Patient tobacco use? No.   -  hx of drug use? No.    2. Infant care and feeding:  -Patient currently breastmilk feeding? Yes. Discussed returning to work and pumping.  -Social determinants of health ([place]) reviewed in [place]. The following needs were identified  3. Sexuality, contraception and birth spacing - Patient does not want a pregnancy in the next year.  Desired family size is 3 children.  - Reviewed reproductive life planning. Reviewed contraceptive methods based on pt preferences and effectiveness.  Patient desired IUD or  IUS today.   - Discussed birth spacing of 18 months  4. Sleep and fatigue -Encouraged family/partner/community support of 4 hrs of uninterrupted sleep to help with mood and fatigue  5. Physical Recovery  - Discussed patients delivery and complications. She describes her labor as good. - Patient had a Vaginal, no problems at delivery. Patient had a 1st degree laceration. Perineal healing reviewed. Patient expressed understanding - Patient has urinary incontinence? No. - Patient is safe to resume physical and sexual activity  6.  Health Maintenance - HM due items addressed Yes - Last pap smear No results found for: "DIAGPAP" Pap smear not done at today's visit. Last pap smear 10/25/2021 was normal  -Breast Cancer screening indicated? No.   7. Chronic Disease/Pregnancy Condition follow up: Gestational Diabetes - Will schedule 2 hour PP GTT  - PCP follow up  Kerry Hough, [place] for [place]

## 2022-06-13 ENCOUNTER — Other Ambulatory Visit: Payer: Self-pay | Admitting: *Deleted

## 2022-06-13 DIAGNOSIS — O24429 Gestational diabetes mellitus in childbirth, unspecified control: Secondary | ICD-10-CM

## 2022-06-19 ENCOUNTER — Other Ambulatory Visit: Payer: Self-pay

## 2022-06-19 ENCOUNTER — Ambulatory Visit (INDEPENDENT_AMBULATORY_CARE_PROVIDER_SITE_OTHER): Payer: Self-pay | Admitting: Medical

## 2022-06-19 ENCOUNTER — Encounter: Payer: Self-pay | Admitting: Medical

## 2022-06-19 VITALS — BP 128/82 | HR 97 | Wt 127.3 lb

## 2022-06-19 DIAGNOSIS — Z30431 Encounter for routine checking of intrauterine contraceptive device: Secondary | ICD-10-CM

## 2022-06-19 DIAGNOSIS — O24429 Gestational diabetes mellitus in childbirth, unspecified control: Secondary | ICD-10-CM

## 2022-06-19 NOTE — Progress Notes (Unsigned)
  History:  Ms. Doris Hendricks is a 36 y.o. [phone] who presents to clinic today for IUD string check. IUD was placed by me in the office at [facility] visit on 05/17/22. The patient had intermittent spotting following insertion, but denies bleeding or pain today. She is also getting her PP GTT today due to a history of GDM.    The following portions of the patient's history were reviewed and updated as appropriate: allergies, current medications, family history, past medical history, social history, past surgical history and problem list.  Review of Systems:  Review of Systems  Constitutional:  Negative for fever.  Gastrointestinal:  Negative for abdominal pain.  Genitourinary:        Neg- vaginal bleeding, discharge      Objective:  Physical Exam BP 128/82   Pulse 97   Wt 127 lb 4.8 oz (57.7 kg)   BMI 25.28 kg/m  Physical Exam Exam conducted with a chaperone present.  Constitutional:      General: She is not in acute distress.    Appearance: Normal appearance.  Cardiovascular:     Rate and Rhythm: Normal rate.  Pulmonary:     Effort: Pulmonary effort is normal.  Genitourinary:    General: Normal vulva.     Vagina: No vaginal discharge or bleeding.     Cervix: No friability, erythema or cervical bleeding.     Comments: IUD strings are visualized and appear appropriate length Skin:    General: Skin is warm and dry.     Findings: No erythema.  Neurological:     Mental Status: She is alert and oriented to person, place, and time.     Assessment & Plan:  1. IUD check up - IUD in place, all questions answered regarding normal bleeding patterns for IUD  Approximately 10 minutes of total time was spent with this patient on history taking, physical exam, patient education and documentation.   Marny Lowenstein, PA-C 06/20/2022 4:05 PM

## 2022-06-20 LAB — GLUCOSE TOLERANCE, 2 HOURS
Glucose, 2 hour: 169 mg/dL — ABNORMAL HIGH (ref 70–139)
Glucose, GTT - Fasting: 90 mg/dL (ref 70–99)

## 2022-09-12 IMAGING — US US MFM OB DETAIL+14 WK
1 series · 12 of 28 positions shown · non-contrast
Comparison: none

[Series 1: us mfm ob detail+14 wk · 12 of 122 slices shown]
[im 5/122]
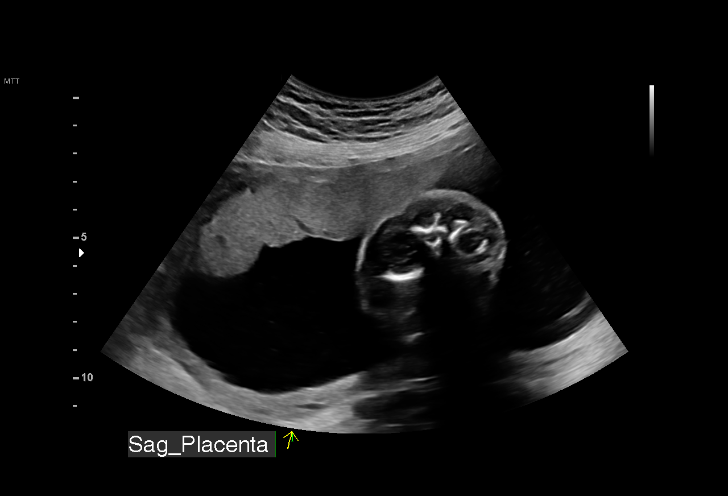
[im 14/122]
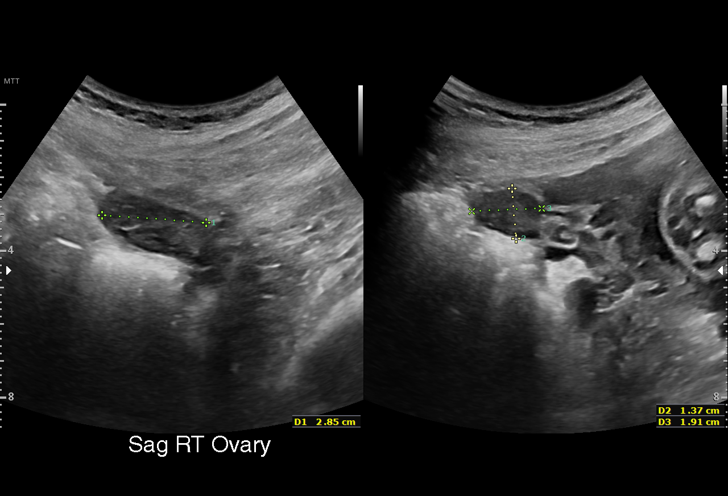
[im 23/122]
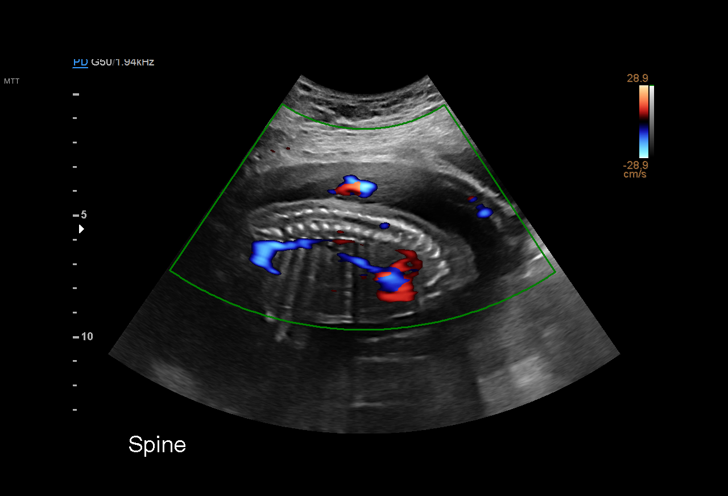
[im 36/122]
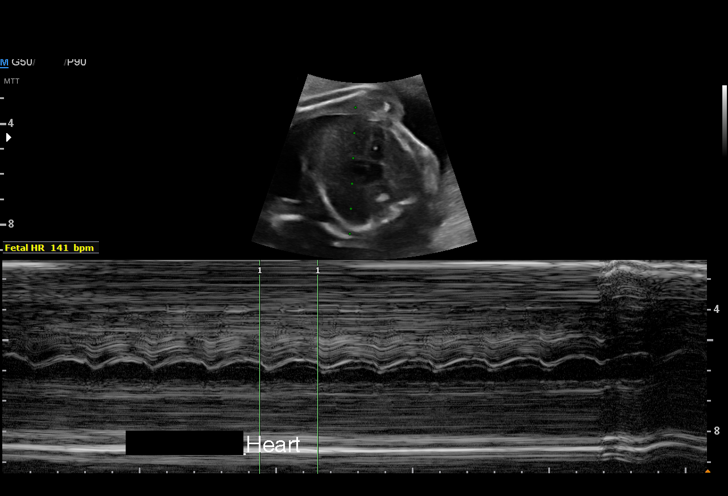
[im 45/122]
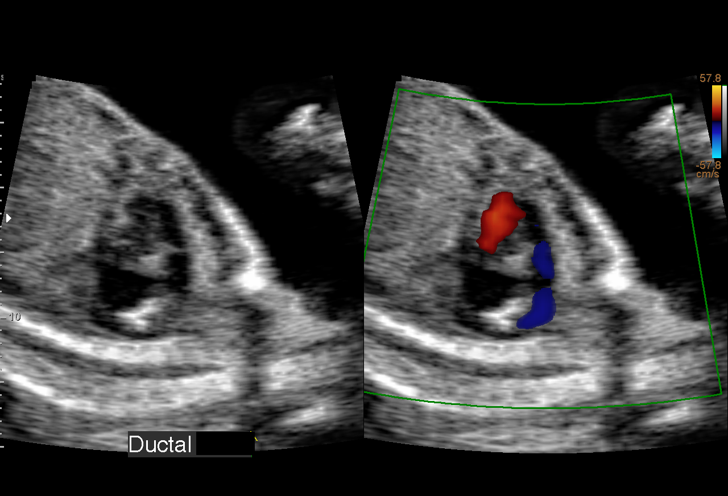
[im 54/122]
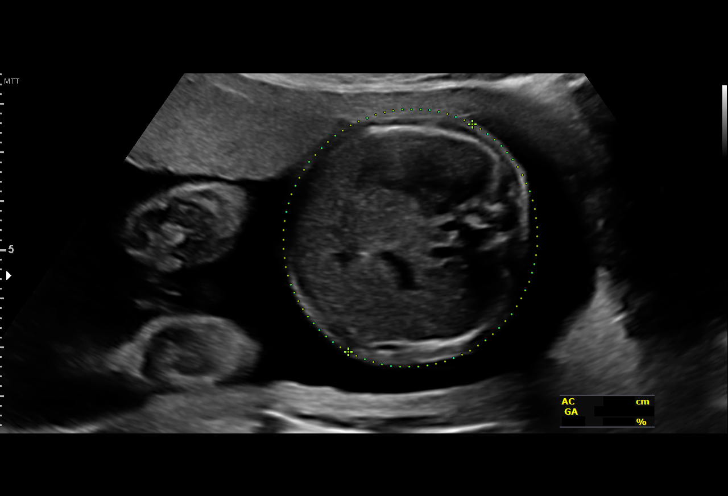
[im 68/122]
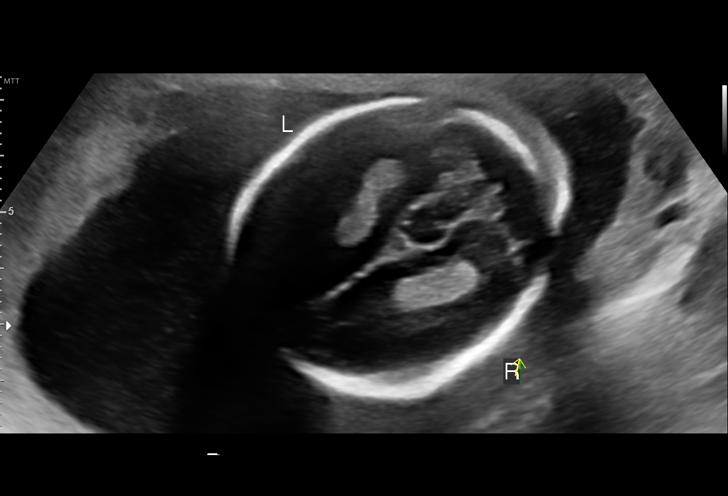
[im 77/122]
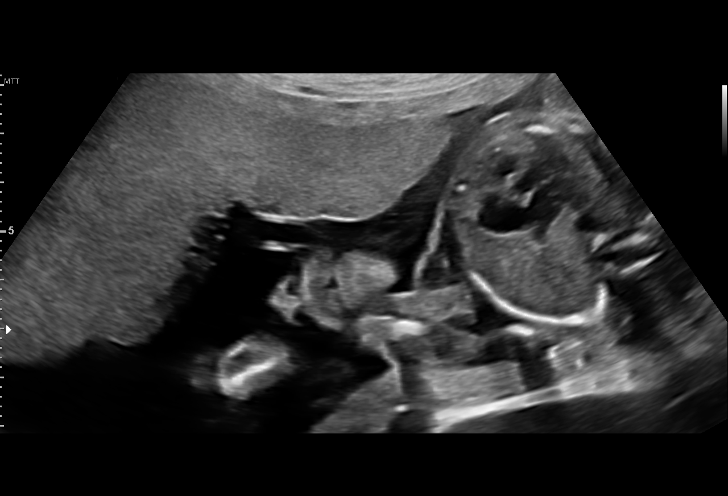
[im 86/122]
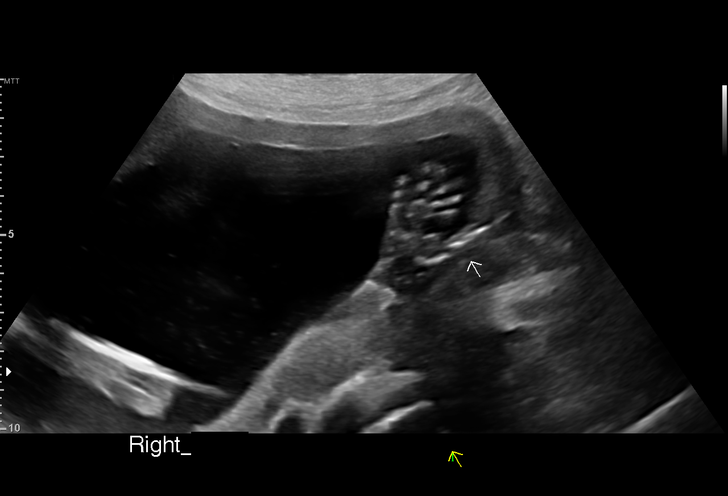
[im 99/122]
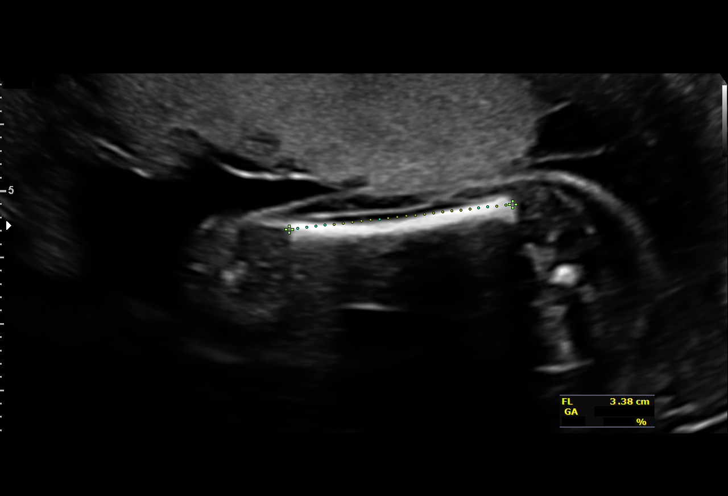
[im 108/122]
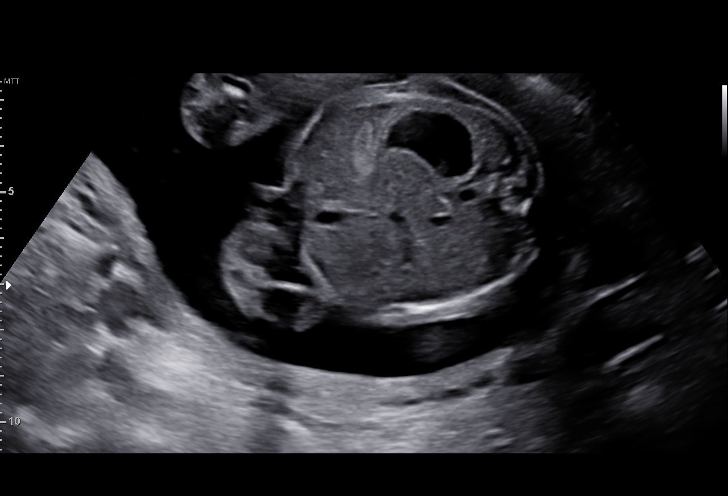
[im 117/122]
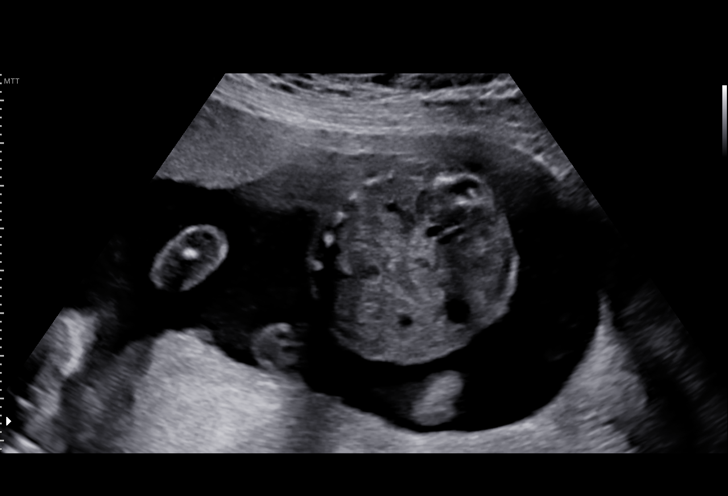

[12 of 28 positions shown; findings below may reference images not displayed]

Indications

 Advanced maternal age multigravida 35+,
 second trimester
 Gestational diabetes in pregnancy, diet
 controlled
 LR NIPS/ AFP Negative
 21 weeks gestation of pregnancy
 Encounter for antenatal screening for
 malformations
 Genetic carrier (Hemoglobin AS trait)
Fetal Evaluation

 Num Of Fetuses:         1
 Cardiac Activity:       Observed
 Presentation:           Cephalic
 Placenta:               Anterior
 P. Cord Insertion:      Visualized, central

 Amniotic Fluid
 AFI FV:      Within normal limits
OB History

 Blood Type:   A+
 Gravidity:    3         Term:   2        Prem:   0        SAB:   0
 TOP:          0       Ectopic:  0        Living: 2
Gestational Age

 LMP:           21w 0d        Date:  07/06/21                 EDD:   04/12/22
 Best:          21w 0d     Det. By:  LMP  (07/06/21)          EDD:   04/12/22
Anatomy

 Cranium:               Appears normal         LVOT:                   Appears normal
 Cavum:                 Appears normal         Aortic Arch:            Not well visualized
 Ventricles:            Appears normal         Ductal Arch:            Appears normal
 Choroid Plexus:        Appears normal         Diaphragm:              Appears normal
 Cerebellum:            Appears normal         Stomach:                Appears normal, left
                                                                       sided
 Posterior Fossa:       Appears normal         Abdomen:                Appears normal
 Nuchal Fold:           Appears normal         Abdominal Wall:         Appears nml (cord
                                                                       insert, abd wall)
 Face:                  Orbits nl; profile not Cord Vessels:           Appears normal (3
                        well visualized                                vessel cord)
 Lips:                  Appears normal         Kidneys:                Appear normal
 Palate:                Not well visualized    Bladder:                Appears normal
 Thoracic:              Appears normal         Spine:                  Appears normal
 Heart:                 Appears normal; EIF    Upper Extremities:      Appears normal
 RVOT:                  Appears normal         Lower Extremities:      Appears normal

 Other:  Fetus appears to be female. Lenses, Feet and open hands/5th digits
         visualized. Technically difficult due to fetal position.
Cervix Uterus Adnexa

 Cervix
 Normal appearance by transabdominal scan.

 Uterus
 No abnormality visualized.

 Right Ovary
 Within normal limits.

 Left Ovary
 Within normal limits.

 Cul De Sac
 No free fluid seen.

 Adnexa
 No abnormality visualized.
Comments

 Kiyoaki Toku was seen for a detailed fetal anatomy
 scan due to advanced maternal age.  She was recently
 diagnosed with diet-controlled gestational diabetes.  She was
 screened early for gestational diabetes as she had
 gestational diabetes during her last pregnancy.
 She had a cell free DNA test earlier in her pregnancy which
 indicated a low risk for trisomy 21, 18, and 13. A female fetus
 is predicted.
 She was informed that the fetal growth and amniotic fluid
 level were appropriate for her gestational age.
 On today's exam, an intracardiac echogenic focus was noted
 in the left ventricle of the fetal heart.  The small association
 between an echogenic focus and Down syndrome was
 discussed.
 Due to the echogenic focus noted today, the patient was
 offered and declined an amniocentesis today for definitive
 diagnosis of fetal aneuploidy.  She reports that she is
 comfortable with her negative cell free DNA test.
 Her last child was also found to have an echogenic focus
 during the fetal anatomy scan.  That child is doing well today.
 Therefore, she is less concerned regarding this finding.
 The patient was informed that anomalies may be missed due
 to technical limitations. If the fetus is in a suboptimal position
 or maternal habitus is increased, visualization of the fetus in
 the maternal uterus may be impaired.
 The following were discussed today:
 Diet-controlled gestational diabetes
 The implications and management of diabetes in pregnancy
 was discussed in detail with the patient.
 She was advised to continue to monitor her fingersticks 4
 times daily (fasting and 2 hours after each meal).
 She was advised that our goals for her fingerstick values are
 fasting values of 90-95 or less and two-hour postprandial
 values of 120 or less.
 Should the majority of her fingerstick results be above these
 values, she may have to be started on insulin or metformin to
 help her achieve better glycemic control.
 The patient was advised that getting her fingerstick values as
 close to these goals as possible would provide her with the
 most optimal obstetrical outcome.
 The increased risk of polyhydramnios, fetal macrosomia, and
 preeclampsia associated with diabetes was also discussed.
 Delivery for well-controlled diabetes in pregnancy is usually
 recommended at around 39 weeks.  Delivery at 37 weeks
 may be considered should her glycemic control be poor.
 Due to advanced maternal age and gestational diabetes, we
 will continue to follow her with monthly growth ultrasounds.
 The patient stated that all of her questions have been
 answered.
 A follow-up exam was scheduled in 4 weeks.
 All conversations were held with the patient today with the
 help of a Spanish interpreter.
 A total of 30 minutes was spent counseling and coordinating
 the care for this patient.  Greater than 50% of the time was
 spent in direct face-to-face contact.

## 2022-11-06 IMAGING — US US MFM OB FOLLOW-UP
1 series · 13 of 28 positions shown · non-contrast
Comparison: none

[Series 1: us mfm ob follow-up · 38 acquisitions, 13 frames shown]
[im 2/38]
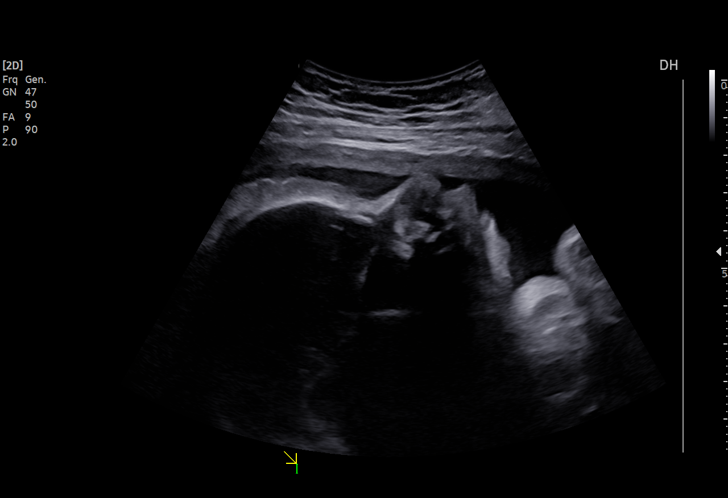
[im 5/38]
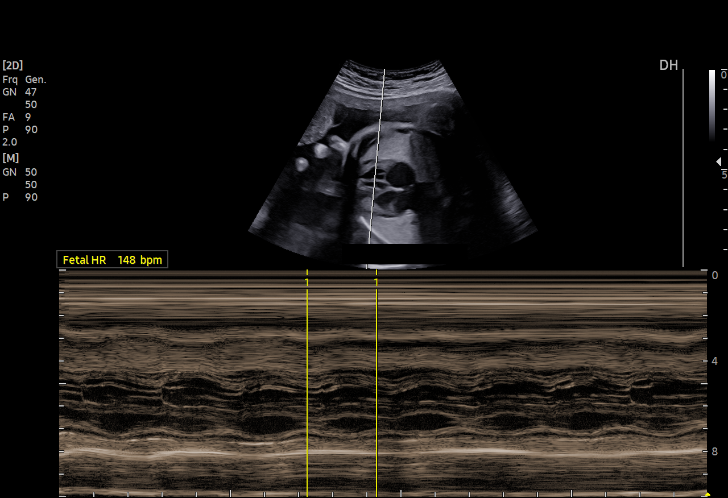
[im 7/38]
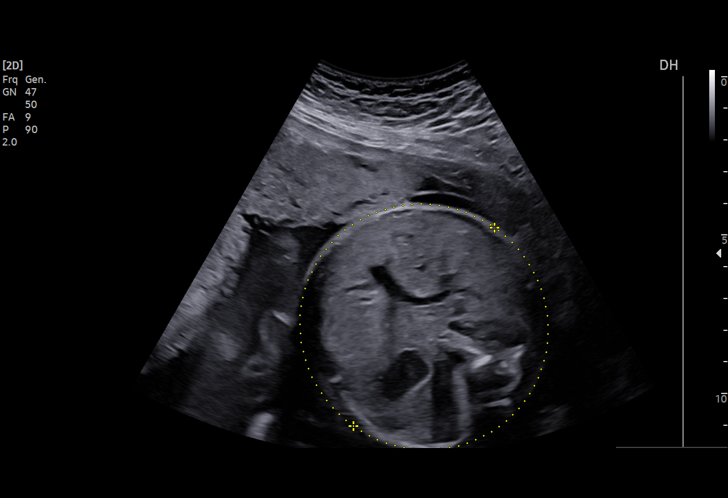
[im 10/38]
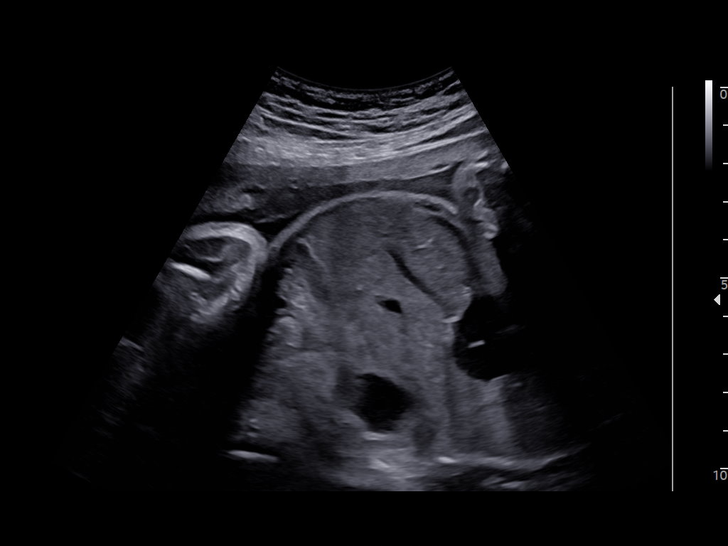
[im 13/38]
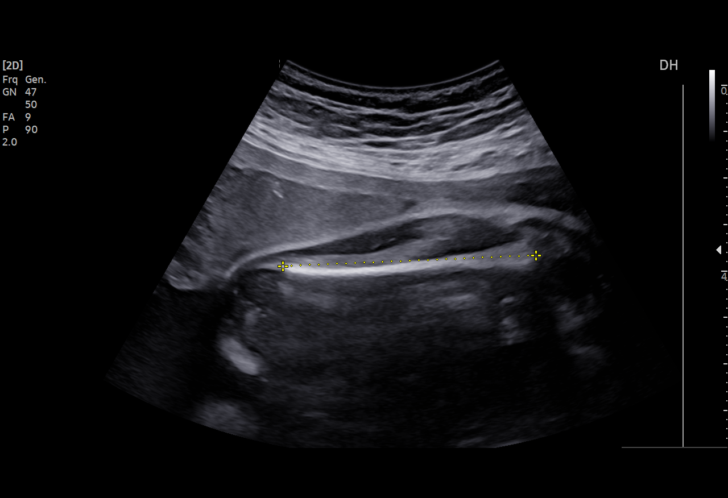
[im 16/38]
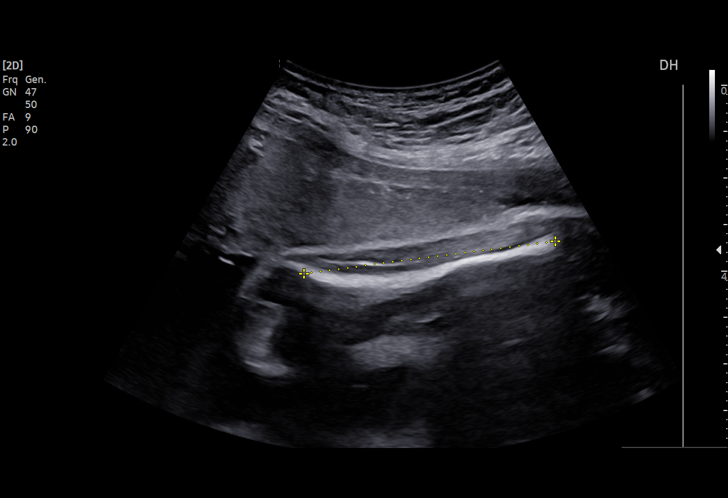
[im 20/38]
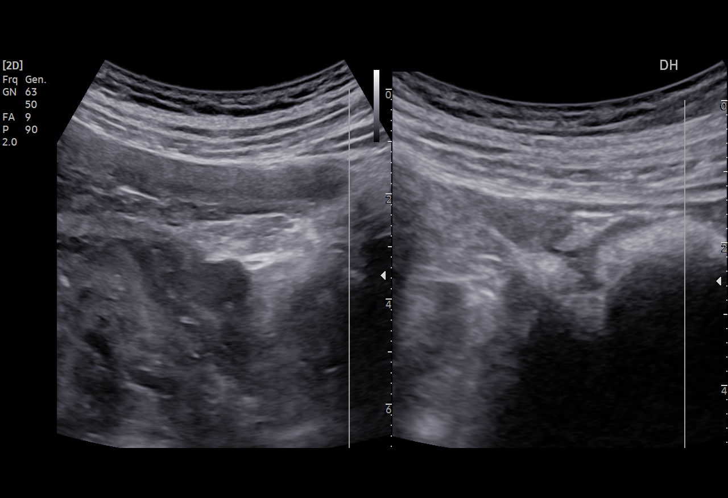
[im 22/38]
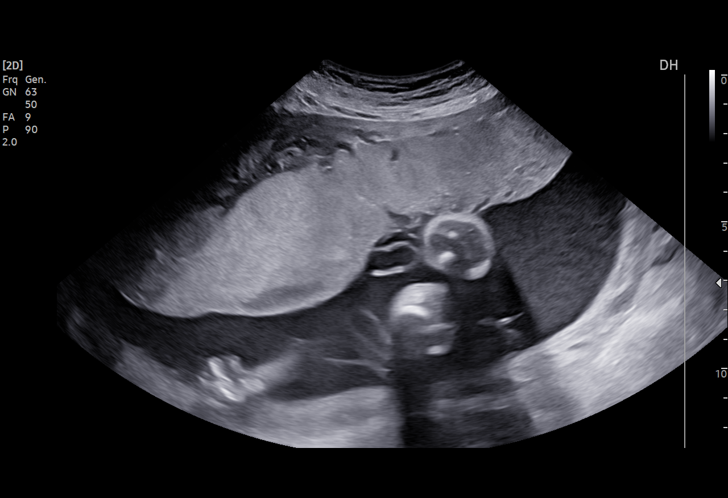
[im 25/38]
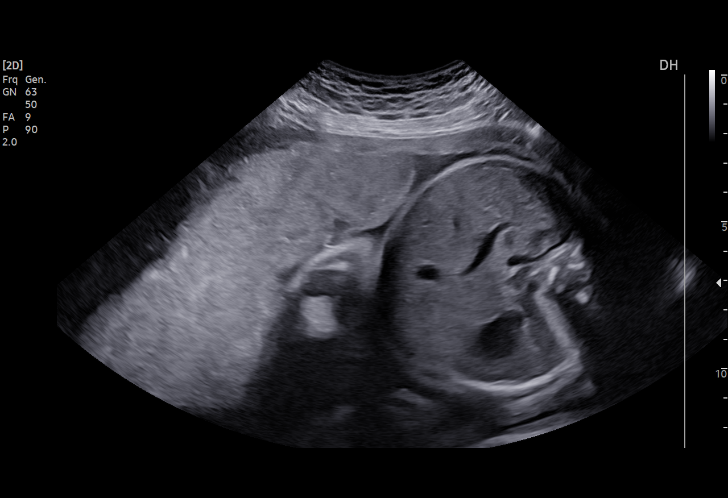
[im 28/38]
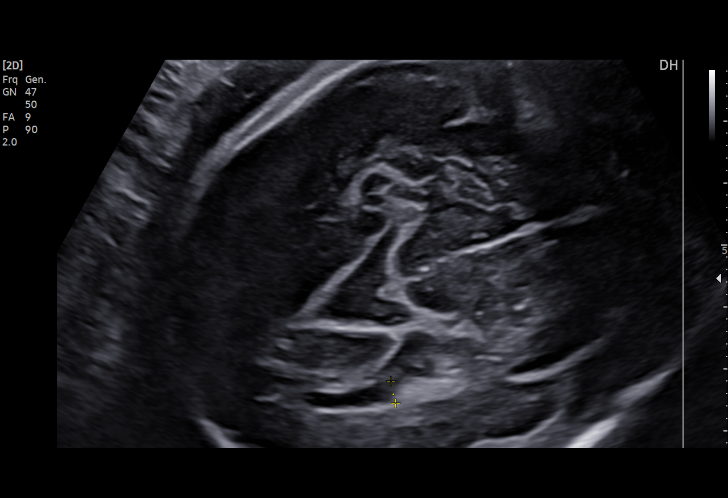
[im 31/38]
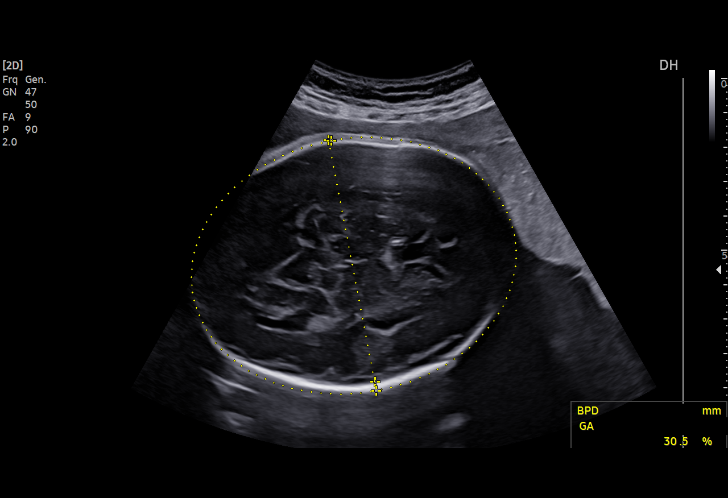
[im 33/38]
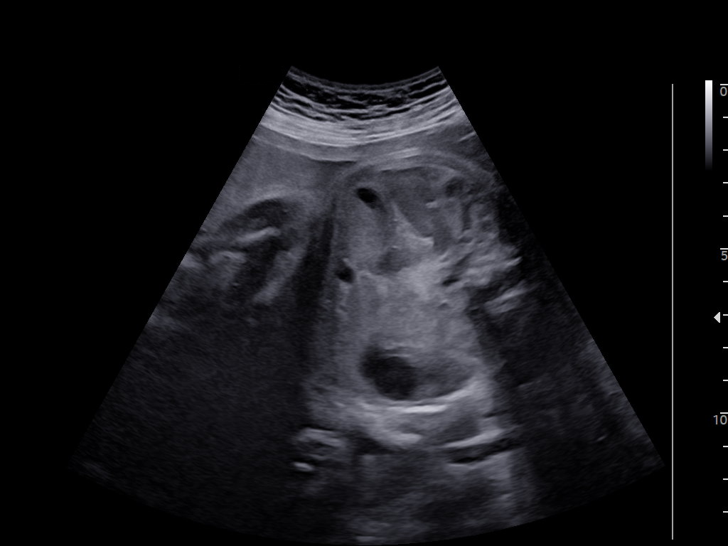
[im 36/38]
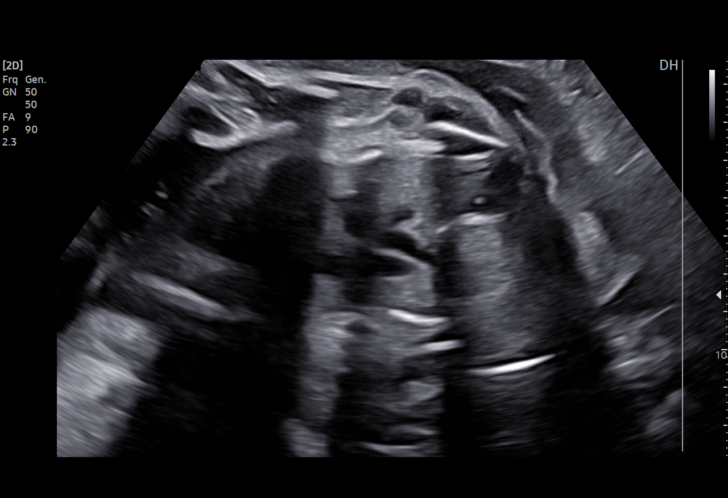

[13 of 28 positions shown; findings below may reference images not displayed]

LHOMME [facility]

Indications

 Gestational diabetes in pregnancy,
 controlled by oral hypoglycemic drugs
 Advanced maternal age primigravida 35+,
 third trimester
 28 weeks gestation of pregnancy
 LR NIPS/ AFP Negative
 Genetic carrier (Hemoglobin AS trait)
Fetal Evaluation

 Num Of Fetuses:         1
 Fetal Heart Rate(bpm):  148
 Cardiac Activity:       Observed
 Presentation:           Transverse, head to maternal right
 Placenta:               Anterior
 P. Cord Insertion:      Previously Visualized

 Amniotic Fluid
 AFI FV:      Within normal limits

 AFI Sum(cm)     %Tile       Largest Pocket(cm)
 11.72           26

 RUQ(cm)       RLQ(cm)       LUQ(cm)        LLQ(cm)

Biometry
 BPD:     71.29  mm     G. Age:  28w 4d         30  %    CI:        73.94   %    70 - 86
                                                         FL/HC:      20.4   %    19.6 -
 HC:      263.3  mm     G. Age:  28w 5d         14  %    HC/AC:      1.08        0.99 -
 AC:    244.56   mm     G. Age:  28w 5d         38  %    FL/BPD:     75.5   %    71 - 87
 FL:      53.79  mm     G. Age:  28w 4d         25  %    FL/AC:      22.0   %    20 - 24
 LV:        3.9  mm

 Est. FW:    7878  gm    2 lb 12 oz      29  %
OB History

 Blood Type:   A+
 Gravidity:    3         Term:   2        Prem:   0        SAB:   0
 TOP:          0       Ectopic:  0        Living: 2
Gestational Age

 LMP:           28w 6d        Date:  07/06/21                 EDD:   04/12/22
 U/S Today:     28w 5d                                        EDD:   04/13/22
 Best:          28w 6d     Det. By:  LMP  (07/06/21)          EDD:   04/12/22
Anatomy

 Cranium:               Appears normal         LVOT:                   Previously seen
 Cavum:                 Appears normal         Aortic Arch:            Appears normal
 Ventricles:            Appears normal         Ductal Arch:            Previously seen
 Choroid Plexus:        Previously seen        Diaphragm:              Appears normal
 Cerebellum:            Previously seen        Stomach:                Appears normal, left
                                                                       sided
 Posterior Fossa:       Previously seen        Abdomen:                Previously seen
 Nuchal Fold:           Previously seen        Abdominal Wall:         Previously seen
 Face:                  Orbits nl; profile not Cord Vessels:           Previously seen
                        well visualized
 Lips:                  Previously seen        Kidneys:                Appear normal
 Palate:                Not well visualized    Bladder:                Appears normal
 Thoracic:              Appears normal         Spine:                  Previously seen
                        Previously seen
 Heart:                 Previously seen        Upper Extremities:      Previously seen
 RVOT:                  Previously seen        Lower Extremities:      Previously seen

 Other:  Female gender previously seen. Lenses, Feet and open hands/5th
         digits previously visualized. Technically difficult due to fetal position.
Cervix Uterus Adnexa

 Cervix
 Not visualized (advanced GA >32wks)

 Uterus
 No abnormality visualized.

 Right Ovary
 Within normal limits.

 Left Ovary
 Not visualized.
 Cul De Sac
 No free fluid seen.

 Adnexa
 No abnormality visualized.
Comments

 This patient was seen for a follow up growth scan due to
 gestational diabetes that is treated with metformin.  She
 denies any problems since her last exam.
 She was informed that the fetal growth and amniotic fluid
 level appears appropriate for her gestational age.
 Due to gestational diabetes treated with metformin, we will
 start weekly fetal testing at 32 weeks.
 She will return in 3 weeks for a BPP.
